# Patient Record
Sex: Female | Born: 1965
Health system: Southern US, Community
[De-identification: ages and names within clinical notes are randomized; demographics above are authoritative.]

## PROBLEM LIST (undated history)

## (undated) DIAGNOSIS — K219 Gastro-esophageal reflux disease without esophagitis: Secondary | ICD-10-CM

## (undated) DIAGNOSIS — F32A Depression, unspecified: Secondary | ICD-10-CM

## (undated) DIAGNOSIS — R32 Unspecified urinary incontinence: Secondary | ICD-10-CM

## (undated) DIAGNOSIS — M1990 Unspecified osteoarthritis, unspecified site: Secondary | ICD-10-CM

## (undated) DIAGNOSIS — E039 Hypothyroidism, unspecified: Secondary | ICD-10-CM

## (undated) DIAGNOSIS — F419 Anxiety disorder, unspecified: Secondary | ICD-10-CM

## (undated) DIAGNOSIS — K921 Melena: Secondary | ICD-10-CM

## (undated) DIAGNOSIS — F329 Major depressive disorder, single episode, unspecified: Secondary | ICD-10-CM

## (undated) HISTORY — DX: Hypothyroidism, unspecified: E03.9

## (undated) HISTORY — DX: Major depressive disorder, single episode, unspecified: F32.9

## (undated) HISTORY — DX: Melena: K92.1

## (undated) HISTORY — DX: Unspecified urinary incontinence: R32

## (undated) HISTORY — DX: Gastro-esophageal reflux disease without esophagitis: K21.9

## (undated) HISTORY — DX: Unspecified osteoarthritis, unspecified site: M19.90

## (undated) HISTORY — DX: Depression, unspecified: F32.A

---

## 2001-01-07 ENCOUNTER — Emergency Department (HOSPITAL_COMMUNITY): Admission: EM | Admit: 2001-01-07 | Discharge: 2001-01-07 | Payer: Self-pay

## 2010-03-03 DIAGNOSIS — R32 Unspecified urinary incontinence: Secondary | ICD-10-CM

## 2010-03-03 HISTORY — DX: Unspecified urinary incontinence: R32

## 2010-03-03 HISTORY — PX: PARTIAL HYSTERECTOMY: SHX80

## 2010-04-10 ENCOUNTER — Encounter (HOSPITAL_COMMUNITY)
Admission: RE | Admit: 2010-04-10 | Discharge: 2010-04-10 | Disposition: A | Discharge status: Home / Self Care | Payer: Managed Care, Other (non HMO) | Source: Ambulatory Visit | Attending: Obstetrics | Admitting: Obstetrics

## 2010-04-10 DIAGNOSIS — Z01812 Encounter for preprocedural laboratory examination: Secondary | ICD-10-CM | POA: Insufficient documentation

## 2010-04-10 LAB — SURGICAL PCR SCREEN
MRSA, PCR: NEGATIVE
Staphylococcus aureus: NEGATIVE

## 2010-04-10 LAB — BASIC METABOLIC PANEL
BUN: 8 mg/dL (ref 6–23)
CO2: 25 mEq/L (ref 19–32)
Calcium: 9.4 mg/dL (ref 8.4–10.5)
Chloride: 104 mEq/L (ref 96–112)
Creatinine, Ser: 0.66 mg/dL (ref 0.4–1.2)
GFR calc Af Amer: >60 mL/min (ref >60)
GFR calc non Af Amer: >60 mL/min (ref >60)
Glucose, Bld: 105 mg/dL — ABNORMAL HIGH (ref 70–99)
Potassium: 4.1 mEq/L (ref 3.5–5.1)
Sodium: 136 mEq/L (ref 135–145)

## 2010-04-10 LAB — CBC
HCT: 44.5 % (ref 36.0–46.0)
Hemoglobin: 14.5 g/dL (ref 12.0–15.0)
MCH: 30 pg (ref 26.0–34.0)
MCHC: 32.6 g/dL (ref 30.0–36.0)
MCV: 92.1 fL (ref 78.0–100.0)
Platelets: 310 10*3/uL (ref 150–400)
RBC: 4.83 MIL/uL (ref 3.87–5.11)
RDW: 13.7 % (ref 11.5–15.5)
WBC: 9.6 10*3/uL (ref 4.0–10.5)

## 2010-04-17 ENCOUNTER — Other Ambulatory Visit: Payer: Self-pay | Admitting: Obstetrics

## 2010-04-17 ENCOUNTER — Ambulatory Visit (HOSPITAL_COMMUNITY)
Admission: RE | Admit: 2010-04-17 | Discharge: 2010-04-18 | Disposition: A | Discharge status: Home / Self Care | Payer: Managed Care, Other (non HMO) | Source: Ambulatory Visit | Attending: Obstetrics | Admitting: Obstetrics

## 2010-04-17 DIAGNOSIS — N949 Unspecified condition associated with female genital organs and menstrual cycle: Secondary | ICD-10-CM | POA: Insufficient documentation

## 2010-04-17 DIAGNOSIS — N920 Excessive and frequent menstruation with regular cycle: Secondary | ICD-10-CM | POA: Insufficient documentation

## 2010-04-17 DIAGNOSIS — D251 Intramural leiomyoma of uterus: Secondary | ICD-10-CM | POA: Insufficient documentation

## 2010-04-17 LAB — TYPE AND SCREEN
ABO/RH(D): A POS
Antibody Screen: NEGATIVE

## 2010-04-17 LAB — PREGNANCY, URINE: Preg Test, Ur: NEGATIVE

## 2010-04-18 LAB — CBC
HCT: 34.9 % — ABNORMAL LOW (ref 36.0–46.0)
Hemoglobin: 11.5 g/dL — ABNORMAL LOW (ref 12.0–15.0)
MCV: 91.8 fL (ref 78.0–100.0)
Platelets: 277 10*3/uL (ref 150–400)
RBC: 3.8 MIL/uL — ABNORMAL LOW (ref 3.87–5.11)
WBC: 16.9 10*3/uL — ABNORMAL HIGH (ref 4.0–10.5)

## 2010-05-10 NOTE — Op Note (Signed)
Jessica Kemp, Jessica Kemp              ACCOUNT NO.:  [id number]  MEDICAL RECORD NO.:  [id number]           PATIENT TYPE:  I  LOCATION:  9310                          FACILITY:  [facility]  PHYSICIAN:  Lendon Colonel, MD   DATE OF BIRTH:  September 17, 1965  DATE OF PROCEDURE:  04/17/2010 DATE OF DISCHARGE:                              OPERATIVE REPORT   PREOPERATIVE DIAGNOSES:  Pelvic pain, menorrhagia, fibroid uterus.  POSTOPERATIVE DIAGNOSES:  Pelvic pain, menorrhagia, fibroid uterus.  PROCEDURE:  Robotic-assisted total laparoscopic hysterectomy.  SURGEON:  Alphonsus Sias. Ernestina Penna, MD  ASSISTANT: 1. Genia Del, MD 2. Arlan Organ, MD  ANESTHESIA:  General.  FINDINGS:  A 2-cm anterior uterine fibroid, adhesed bladder flap, otherwise normal pelvis, normal ovaries, normal tubes, bilateral peristalsing ureters pre and post procedure.  Normal liver edge.  SPECIMENS:  Uterus and cervix to Pathology.  ANTIBIOTICS:  Ancef 1 g.  ESTIMATED BLOOD LOSS:  100 mL.  COMPLICATIONS:  None.  INDICATIONS:  A 45 year old multiparous patient with complaints of menorrhagia and continued pelvic pain.  The patient declined other options of treatment including hormonal management, IUD, endometrial ablation, and opted instead for hysterectomy. Given her prior surgical history a robotic-assisted hysterectomy was done.  PROCEDURE:  After informed consent was obtained, the patient was taken to the operating room where general anesthesia was initiated without difficulty.  She was prepped and draped in the normal sterile fashion in dorsal supine lithotomy position.  A Foley catheter was inserted sterilely into the bladder.  Weighted speculum placed into vagina. Single-tooth tenaculum used to grasp the anterior lip of the cervix. Cervix was sounded and serially dilated to a #21 Pratt.  The #8 RUMI Koh sheath was used with the medium Koh ring.  This was easily inserted into the patient's uterus and gloves were  changed.  Attention was turned to the patient's abdomen.  A 10-mm skin incision was made in the supraumbilical fold after first infusing with 0.5% Marcaine.  Veress needle was inserted into the abdomen.  Pneumoperitoneum was created to 15 mm of CO2.  A non-bladed trocar was then inserted into the peritoneal cavity.  Intraperitoneal placement was confirmed with the use of the laparoscope.  Pneumoperitoneum was maintained.  The abdomen was evaluated.  No adhesions were noted in the anterior abdominal wall, and skin markings were placed on the planned port sites.  These were then injected with 0.5% Marcaine, 8-mm skin incisions were made in the left lateral marks, and an 8 and a 12-mm incision were made on the right side.  The larger one being for the assistant port.  Ports were placed under direct visualization.  The robot was then docked to the patient's ports.  Instruments were placed under visualization with the camera. Once all instruments were in the field of the scope, I went over to the console.  The uterus was anteverted, tubes and ovaries were retracted bilateral, ureters were followed down to the pelvis.  The right round ligament was serially grasped and cauterized with the PK and transected with monopolar scissors.  The anterior leaf of the broad ligament was dissected out with monopolar scissors  and incised.  The right utero- ovarian ligament was serially grasped, cauterized, and eventually transected with the monopolar scissors.  Dissection down the posterior leaf of broad ligament was done with care to avoid the ureter, and the anterior bladder flap was dissected partially.  Some scarring was noted. Attention was turned the patient's left of the pelvis.  The left round ligament was serially grasped, cauterized with PK and transected with monopolar scissors.  The left utero-ovarian ligament was transected after cautery with the bipolar PK device.  The broad ligament was  opened up.  Once at the level of the bladder flap due to the prior adhesions, the bladder was filled with saline to help identify the edges of the bladder.  Monopolar scissors were used to further clear at the midpoint of the bladder dissection.  Once a good plane was noted, additional blunt dissection was done to clear the bladder flap off the cervix.  The uterus was placed on stretch.  The left uterine artery was serially cauterized and then transected.  Once good hemostasis and blanching of the side of the uterus was noted, uterus was placed on stretch, and the right uterine artery was cauterized and transected.  Additional cautery was done over the planned cuff incision, and the monopolar scissors were then used on the anterior vaginal wall to dissect down to the RUMI cup.  Once this was seen, the dissection was carried out laterally, the uterus was then anteverted, a posterior colpotomy was made with the monopolar scissors.  The PK bipolar was used for hemostasis when needed and the colpotomy was completed.  The uterus was removed through the vagina. Some cautery was done on the vaginal cuff.  Instruments were changed, needle drivers were placed into the robotic ports, and the right angle had a figure-of-eight suture along it.  The left angle figure-of-eight was then done and three additional figure-of-eight sutures were placed in the midpoint of the vaginal cuff.  Excellent hemostasis was noted. Irrigation was done.  Again, CO2 was minimized.  No bleeding was noted. Robotic instruments were removed.  The robot was undocked.  We went laparoscopically and placed the patient in reverse Trendelenburg to allow the fluid to collect into the pelvis.  This was suctioned out. Additional irrigation was done.  All cut pedicles were reinspected, found to be hemostatic.  The right lower quadrant port had its fascial incision closed with a 0 Vicryl on a UR hook needle with pneumoperitoneum and  laparoscope to ensure no underlying structures were caught in that stitch.  All gas was removed.  All ports were removed. The fascial incision at the umbilical port was closed with a single figure-of-eight suture.  4-0 Vicryl was used to close the skin and Octylseal was placed at the skin sites.  The patient tolerated the procedure well.  Sponge, lap and needle counts were correct x3.  The patient was taken to the recovery room in stable condition.     Lendon Colonel, MD     KAF/MEDQ  D:  04/17/2010  T:  04/18/2010  Job:  [id number]  Electronically Signed by Noland Fordyce MD on 05/09/2010 07:39:04 PM

## 2015-12-22 ENCOUNTER — Emergency Department (HOSPITAL_COMMUNITY)
Admission: EM | Admit: 2015-12-22 | Discharge: 2015-12-22 | Disposition: A | Discharge status: Home / Self Care | Payer: Managed Care, Other (non HMO) | Attending: Emergency Medicine | Admitting: Emergency Medicine

## 2015-12-22 ENCOUNTER — Emergency Department (HOSPITAL_COMMUNITY): Payer: Managed Care, Other (non HMO)

## 2015-12-22 ENCOUNTER — Encounter (HOSPITAL_COMMUNITY): Payer: Self-pay | Admitting: *Deleted

## 2015-12-22 DIAGNOSIS — R109 Unspecified abdominal pain: Secondary | ICD-10-CM | POA: Diagnosis present

## 2015-12-22 DIAGNOSIS — R197 Diarrhea, unspecified: Secondary | ICD-10-CM | POA: Insufficient documentation

## 2015-12-22 DIAGNOSIS — Z87891 Personal history of nicotine dependence: Secondary | ICD-10-CM | POA: Diagnosis not present

## 2015-12-22 LAB — COMPREHENSIVE METABOLIC PANEL
ALBUMIN: 4.5 g/dL (ref 3.5–5.0)
ALK PHOS: 61 U/L (ref 38–126)
ALT: 45 U/L (ref 14–54)
AST: 24 U/L (ref 15–41)
Anion gap: 9 (ref 5–15)
BILIRUBIN TOTAL: 0.5 mg/dL (ref 0.3–1.2)
BUN: 13 mg/dL (ref 6–20)
CALCIUM: 9.6 mg/dL (ref 8.9–10.3)
CO2: 22 mmol/L (ref 22–32)
Chloride: 106 mmol/L (ref 101–111)
Creatinine, Ser: 0.63 mg/dL (ref 0.44–1.00)
GFR calc Af Amer: >60 mL/min (ref >60)
GLUCOSE: 94 mg/dL (ref 65–99)
POTASSIUM: 4.1 mmol/L (ref 3.5–5.1)
Sodium: 137 mmol/L (ref 135–145)
TOTAL PROTEIN: 7.8 g/dL (ref 6.5–8.1)

## 2015-12-22 LAB — CBC
HEMATOCRIT: 43.5 % (ref 36.0–46.0)
Hemoglobin: 14.8 g/dL (ref 12.0–15.0)
MCH: 30.9 pg (ref 26.0–34.0)
MCHC: 34 g/dL (ref 30.0–36.0)
MCV: 90.8 fL (ref 78.0–100.0)
PLATELETS: 319 10*3/uL (ref 150–400)
RBC: 4.79 MIL/uL (ref 3.87–5.11)
RDW: 13.7 % (ref 11.5–15.5)
WBC: 11.3 10*3/uL — AB (ref 4.0–10.5)

## 2015-12-22 LAB — LIPASE, BLOOD: Lipase: 19 U/L (ref 11–51)

## 2015-12-22 MED ORDER — OXYCODONE-ACETAMINOPHEN 5-325 MG PO TABS
1.0000 | ORAL_TABLET | ORAL | Status: DC | PRN
  Administered 2015-12-22: 1 via ORAL
  Filled 2015-12-22: qty 1

## 2015-12-22 MED ORDER — ONDANSETRON 4 MG PO TBDP
4.0000 mg | ORAL_TABLET | Freq: Once | ORAL | Status: AC | PRN
  Administered 2015-12-22: 4 mg via ORAL
  Filled 2015-12-22: qty 1

## 2015-12-22 MED ORDER — HYDROCODONE-ACETAMINOPHEN 5-325 MG PO TABS: 1.0000 | ORAL_TABLET | ORAL | 0 refills | Status: DC | PRN

## 2015-12-22 MED ORDER — ONDANSETRON HCL 4 MG PO TABS: 4.0000 mg | ORAL_TABLET | Freq: Four times a day (QID) | ORAL | 0 refills | Status: DC

## 2015-12-22 NOTE — ED Provider Notes (Signed)
[facility] DEPT Provider Note   CSN: [id number] Arrival date & time: 12/22/15  1754 By signing my name below, I, Dyke Brackett, attest that this documentation has been prepared under the direction and in the presence of non-physician practitioner, Dewaine Oats PA-C Electronically Signed: Dyke Brackett, Scribe. 12/22/2015. 8:06 PM.    History   Chief Complaint Chief Complaint  Patient presents with  . Flank Pain  . Nausea   HPI [name] is a 50 y.o. female who presents to the Emergency Department complaining of right flank pain onset three days ago. Pain is worsened by sitting, coughing or deep breathing. Pt was given percocet in the ED with relief of pain. She notes associated nausea, diarrhea, and vomiting. Vomiting began today with total of one episode. She states she has had intermittent diarrhea for a few months, currently symptomatic x 4 days. Pt sent from urgent care for further evaluation out of concern for potential problems with her gallbladder or kidneys. UA at Urgent Care reported negative. Abdominal SHX includes cesarean section and hysterectomy. Pt denies any hx of kidney stones. No recent abx use. Pt is a former smoker; she denies any tobacco use for last 8 months. She denies fever, hematuria, melena, hematochezia, or SOB.   The history is provided by the patient. No language interpreter was used.   History reviewed. No pertinent past medical history.  There are no active problems to display for this patient.  History reviewed. No pertinent surgical history.  OB History    No data available     Home Medications    Prior to Admission medications   Not on File   Family History No family history on file.  Social History Social History  Substance Use Topics  . Smoking status: Former Research scientist (life sciences)  . Smokeless tobacco: Never Used  . Alcohol use No   Allergies   Review of patient's allergies indicates not on file.  Review of Systems Review of  Systems  Constitutional: Negative for fever.  HENT: Positive for congestion.   Respiratory: Negative for shortness of breath.   Cardiovascular: Negative for chest pain.  Gastrointestinal: Positive for diarrhea, nausea and vomiting. Negative for blood in stool.  Genitourinary: Positive for flank pain. Negative for hematuria.  Musculoskeletal: Negative for myalgias.  Skin: Negative for color change.  Neurological: Negative for syncope and light-headedness.   Physical Exam Updated Vital Signs BP 128/71   Pulse 74   Temp 98.4 F (36.9 C) (Oral)   Resp 18   SpO2 96%   Physical Exam  Constitutional: She is oriented to person, place, and time. She appears well-developed and well-nourished. No distress.  Well appearing  HENT:  Head: Normocephalic and atraumatic.  Eyes: Conjunctivae are normal.  Cardiovascular: Normal rate.   Pulmonary/Chest: Effort normal. She has no wheezes. She has no rales.  Abdominal: Soft. Bowel sounds are normal. She exhibits no distension. There is no hepatosplenomegaly. There is CVA tenderness (right). There is negative Murphy's sign. No hernia.  No RUQ tenderness  Neurological: She is alert and oriented to person, place, and time.  Skin: Skin is warm and dry.  Psychiatric: She has a normal mood and affect.  Nursing note and vitals reviewed.  ED Treatments / Results  DIAGNOSTIC STUDIES:  Oxygen Saturation is 96% on RA, normal by my interpretation.    COORDINATION OF CARE:  8:25 PM Discussed treatment plan with pt at bedside and pt agreed to plan.  Labs (all labs ordered are listed, but only  abnormal results are displayed) Labs Reviewed  LIPASE, BLOOD  COMPREHENSIVE METABOLIC PANEL  CBC   Results for orders placed or performed during the hospital encounter of 12/22/15  Lipase, blood  Result Value Ref Range   Lipase 19 11 - 51 U/L  Comprehensive metabolic panel  Result Value Ref Range   Sodium 137 135 - 145 mmol/L   Potassium 4.1 3.5 - 5.1  mmol/L   Chloride 106 101 - 111 mmol/L   CO2 22 22 - 32 mmol/L   Glucose, Bld 94 65 - 99 mg/dL   BUN 13 6 - 20 mg/dL   Creatinine, Ser 0.63 0.44 - 1.00 mg/dL   Calcium 9.6 8.9 - 10.3 mg/dL   Total Protein 7.8 6.5 - 8.1 g/dL   Albumin 4.5 3.5 - 5.0 g/dL   AST 24 15 - 41 U/L   ALT 45 14 - 54 U/L   Alkaline Phosphatase 61 38 - 126 U/L   Total Bilirubin 0.5 0.3 - 1.2 mg/dL   GFR calc non Af Amer >60 >60 mL/min   GFR calc Af Amer >60 >60 mL/min   Anion gap 9 5 - 15  CBC  Result Value Ref Range   WBC 11.3 (H) 4.0 - 10.5 K/uL   RBC 4.79 3.87 - 5.11 MIL/uL   Hemoglobin 14.8 12.0 - 15.0 g/dL   HCT 43.5 36.0 - 46.0 %   MCV 90.8 78.0 - 100.0 fL   MCH 30.9 26.0 - 34.0 pg   MCHC 34.0 30.0 - 36.0 g/dL   RDW 13.7 11.5 - 15.5 %   Platelets 319 150 - 400 K/uL   EKG  EKG Interpretation None       Radiology No results found. Ct Renal Stone Study  Result Date: 12/22/2015 CLINICAL DATA:  Jessica Kemp is a 50 y.o. female who presents to the Emergency Department complaining of right flank pain onset three days ago. Pain is worsened by sitting, coughing or deep breathing. Nausea and vomiting started today. Diarrhea for several months. EXAM: CT ABDOMEN AND PELVIS WITHOUT CONTRAST TECHNIQUE: Multidetector CT imaging of the abdomen and pelvis was performed following the standard protocol without IV contrast. COMPARISON:  None. FINDINGS: Lower chest: No pulmonary nodules, pleural effusions, or infiltrates. Heart size is normal. No imaged pericardial effusion or significant coronary artery calcifications. Hepatobiliary: No focal liver abnormality is seen. No radiopaque gallstones, gallbladder wall thickening, or biliary dilatation. Pancreas: Unremarkable. No pancreatic ductal dilatation or surrounding inflammatory changes. Spleen: Normal in size without focal abnormality. Adrenals/Urinary Tract: Adrenal glands are unremarkable. Kidneys are normal, without renal calculi, focal lesion, or  hydronephrosis. Bladder is unremarkable. Stomach/Bowel: Stomach is within normal limits. Appendix appears normal. No evidence of bowel wall thickening, distention, or inflammatory changes. Vascular/Lymphatic: There is mild atherosclerotic calcification of the abdominal aorta. No retroperitoneal or mesenteric adenopathy. Reproductive: Status post hysterectomy.  No adnexal mass. Other: No free pelvic fluid. Anterior abdominal wall is unremarkable. Musculoskeletal: No acute or significant osseous findings. IMPRESSION: Normal exam. Electronically Signed   By: Nolon Nations M.D.   On: 12/22/2015 21:50    Procedures Procedures (including critical care time)  Medications Ordered in ED Medications  oxyCODONE-acetaminophen (PERCOCET/ROXICET) 5-325 MG per tablet 1 tablet (1 tablet Oral Given 12/22/15 1837)  ondansetron (ZOFRAN-ODT) disintegrating tablet 4 mg (4 mg Oral Given 12/22/15 1837)    Initial Impression / Assessment and Plan / ED Course  I have reviewed the triage vital signs and the nursing notes.  Pertinent labs & imaging  results that were available during my care of the patient were reviewed by me and considered in my medical decision making (see chart for details).  Clinical Course   Patient presents with right flank pain, nausea, without fever. She is currently pain and nausea free after Percocet x 1 and Zofran. No history of kidney stones. She was seen at Urgent Care and sent for concern for cholecystitis. At this time she has no RUQ, or other abdominal tenderness. There is CVA tenderness  CT renal study performed and is negative. Pain still controlled. Normal lab studies with stable afebrile VS. She is felt appropriate for discharge home and is encouraged to follow up with PCP for recheck.   Final Clinical Impressions(s) / ED Diagnoses   Final diagnoses:  None  1. Right flank pain  New Prescriptions New Prescriptions   No medications on file  I personally performed the  services described in this documentation, which was scribed in my presence. The recorded information has been reviewed and is accurate.      Charlann Lange, PA-C 12/22/15 [place], MD 12/23/15 [id number]

## 2015-12-22 NOTE — ED Triage Notes (Addendum)
Pt complains of right lower back pain, nausea, diarrhea for the past 4 days. Pt states she vomited when she awoke today. Pt denies urinary symptoms. Pt sent from urgent care for further evaluation and concern for problem with gallbladder. Urinalysis performed at [facility] was negative.

## 2015-12-22 NOTE — ED Notes (Signed)
Pain medication given in Triage. Patient advised about side effects of medications and  to avoid driving for a minimum of 4 hours.  

## 2018-03-17 ENCOUNTER — Ambulatory Visit: Payer: BLUE CROSS/BLUE SHIELD | Admitting: Family

## 2018-03-17 ENCOUNTER — Encounter: Payer: Self-pay | Admitting: Family

## 2018-03-17 VITALS — BP 104/54 | HR 73 | Temp 98.5°F | Resp 16 | Ht 61.0 in | Wt 192.0 lb

## 2018-03-17 DIAGNOSIS — R1011 Right upper quadrant pain: Secondary | ICD-10-CM | POA: Diagnosis not present

## 2018-03-17 DIAGNOSIS — L989 Disorder of the skin and subcutaneous tissue, unspecified: Secondary | ICD-10-CM

## 2018-03-17 DIAGNOSIS — R109 Unspecified abdominal pain: Secondary | ICD-10-CM

## 2018-03-17 DIAGNOSIS — K625 Hemorrhage of anus and rectum: Secondary | ICD-10-CM | POA: Diagnosis not present

## 2018-03-17 DIAGNOSIS — R635 Abnormal weight gain: Secondary | ICD-10-CM

## 2018-03-17 LAB — CBC WITH DIFFERENTIAL/PLATELET
BASOS ABS: 0 10*3/uL (ref 0.0–0.1)
Basophils Relative: 0.4 % (ref 0.0–3.0)
EOS PCT: 1.8 % (ref 0.0–5.0)
Eosinophils Absolute: 0.2 10*3/uL (ref 0.0–0.7)
HEMATOCRIT: 43.1 % (ref 36.0–46.0)
Hemoglobin: 14.7 g/dL (ref 12.0–15.0)
LYMPHS ABS: 2.5 10*3/uL (ref 0.7–4.0)
LYMPHS PCT: 25.9 % (ref 12.0–46.0)
MCHC: 34.1 g/dL (ref 30.0–36.0)
MCV: 90.4 fl (ref 78.0–100.0)
MONOS PCT: 5.1 % (ref 3.0–12.0)
Monocytes Absolute: 0.5 10*3/uL (ref 0.1–1.0)
NEUTROS ABS: 6.3 10*3/uL (ref 1.4–7.7)
NEUTROS PCT: 66.8 % (ref 43.0–77.0)
Platelets: 341 10*3/uL (ref 150.0–400.0)
RBC: 4.77 Mil/uL (ref 3.87–5.11)
RDW: 14.5 % (ref 11.5–15.5)
WBC: 9.5 10*3/uL (ref 4.0–10.5)

## 2018-03-17 LAB — COMPREHENSIVE METABOLIC PANEL
ALT: 17 U/L (ref 0–35)
AST: 13 U/L (ref 0–37)
Albumin: 4.4 g/dL (ref 3.5–5.2)
Alkaline Phosphatase: 59 U/L (ref 39–117)
BILIRUBIN TOTAL: 0.4 mg/dL (ref 0.2–1.2)
BUN: 15 mg/dL (ref 6–23)
CHLORIDE: 102 meq/L (ref 96–112)
CO2: 26 meq/L (ref 19–32)
Calcium: 9.5 mg/dL (ref 8.4–10.5)
Creatinine, Ser: 0.79 mg/dL (ref 0.40–1.20)
GFR: 80.83 mL/min (ref >60.00)
Glucose, Bld: 95 mg/dL (ref 70–99)
POTASSIUM: 4.4 meq/L (ref 3.5–5.1)
Sodium: 137 mEq/L (ref 135–145)
Total Protein: 7.1 g/dL (ref 6.0–8.3)

## 2018-03-17 LAB — TSH: TSH: 1.77 u[IU]/mL (ref 0.35–4.50)

## 2018-03-17 MED ORDER — FLUOXETINE HCL 10 MG PO CAPS: 10.0000 mg | ORAL_CAPSULE | Freq: Every day | ORAL | 0 refills | Status: DC

## 2018-03-17 MED ORDER — PANTOPRAZOLE SODIUM 40 MG PO TBEC: 40.0000 mg | DELAYED_RELEASE_TABLET | Freq: Every day | ORAL | 3 refills | Status: DC

## 2018-03-17 MED FILL — FLUoxetine HCL 10 MG CAPS: 10 | 30 days supply | Qty: 30 | Fill #0

## 2018-03-17 MED FILL — PANTOPRAZOLE SOD DR 40 MG T: 40 | 30 days supply | Qty: 30 | Fill #0

## 2018-03-17 NOTE — Patient Instructions (Signed)
Please complete lab work prior to leaving. Begin protonix for your reflux and work on reflux diet as we discussed today at your visit. Begin prozac 10mg  once daily for depression. You will be contacted about scheduling your CT scan to check your abdomen as well as your referral to GI and Dermatology.

## 2018-03-17 NOTE — Progress Notes (Signed)
Subjective:    Patient ID: Jessica Kemp, female    DOB: December 26, 1965, 53 y.o.   MRN: [id number]  HPI  Patient is a 53 year old female who presents today to establish care.  Past medical history is significant for hypothyroidism-this was diagnosed about 25 years ago.  Reports that she has been having difficulty emptying her bowels. Reports sometimes she has loose stools and sometimes.  Notes severe back pain intermittently.   Reports that she ha shad bladder problems since she had he rc section. Used to have to self catheterize for a month after her son was born. Denies dysuria but has sensation of incomplete bladder emptying.    Notes mucoid stools with blood in her stool 3-4 times over the last several month.    Reports that her right side of her abdomen is distended at time and has associated pain in this area. Worse after working. This has been present x 4-5 weeks.   GERD- Reports that she has had "constant heartburn over the last few months." Taking pepto bismol and tums prn.   Depression- feels that she became depressed when her son was ill.  Continues to feel down. Has had weight gain. Is interested in beginning a medication for depression.       Review of Systems  Constitutional: Positive for unexpected weight change.  HENT: Negative for rhinorrhea.   Eyes: Negative for visual disturbance.  Respiratory: Negative for cough.   Cardiovascular: Negative for leg swelling.  Gastrointestinal: Positive for abdominal distention and abdominal pain.   Past Medical History:  Diagnosis Date  . Arthritis Gerd  . Blood in the stool   . Depression   . GERD (gastroesophageal reflux disease) depression  . Hypothyroidism    25 years ago  . Incontinence in female 2012     Social History   Socioeconomic History  . Marital status: Single    Spouse name: Not on file  . Number of children: 3  . Years of education: Not on file  . Highest education level: Not on file  Occupational  History  . Occupation: Medical illustrator  Social Needs  . Financial resource strain: Not hard at all  . Food insecurity:    Worry: Never true    Inability: Never true  . Transportation needs:    Medical: No    Non-medical: No  Tobacco Use  . Smoking status: Former Smoker    Last attempt to quit: 03/03/2015    Years since quitting: 3.0  . Smokeless tobacco: Never Used  Substance and Sexual Activity  . Alcohol use: No  . Drug use: Yes    Types: Marijuana  . Sexual activity: Not Currently  Lifestyle  . Physical activity:    Days per week: 7 days    Minutes per session: 50 min  . Stress: Not on file  Relationships  . Social connections:    Talks on phone: Three times a week    Gets together: Once a week    Attends religious service: Never    Active member of club or organization: No    Attends meetings of clubs or organizations: Never    Relationship status: Divorced  . Intimate partner violence:    Fear of current or ex partner: Not on file    Emotionally abused: Not on file    Physically abused: Not on file    Forced sexual activity: Not on file  Other Topics Concern  . Not on file  Social History Narrative  Lives with her dog   2 sons   1 daughter   6 grandchildren (one son local) On son lives at [place] and daughter in [place]   Works in Insurance underwriter   Enjoys spending time with her grandchildren.     Past Surgical History:  Procedure Laterality Date  . CESAREAN SECTION  1995  . PARTIAL HYSTERECTOMY  2012    Family History  Problem Relation Age of Onset  . Leukemia Mother   . Diabetes Mother   . Hyperlipidemia Mother   . Stroke Mother   . Arthritis Father   . Diabetes Father   . Hypertension Father   . Stroke Father   . Alcohol abuse Brother   . Arthritis Brother   . Hypertension Brother   . Heart disease Maternal Grandmother   . Heart attack Maternal Grandmother   . Heart attack Maternal Grandfather   . Cancer Paternal Grandmother   . Leukemia  Paternal Grandfather   . Lymphoma Son     No Known Allergies  No current outpatient medications on file prior to visit.   No current facility-administered medications on file prior to visit.     BP (!) 104/54 (BP Location: Right Arm, Patient Position: Sitting, Cuff Size: Large)   Pulse 73   Temp 98.5 F (36.9 C) (Oral)   Resp 16   Ht 5\' 1"  (1.549 m)   Wt 192 lb (87.1 kg)   SpO2 100%   BMI 36.28 kg/m       Objective:   Physical Exam Constitutional:      Appearance: She is well-developed.  HENT:     Head: Normocephalic and atraumatic.  Eyes:     General: No scleral icterus. Neck:     Musculoskeletal: Neck supple.     Thyroid: No thyromegaly.  Cardiovascular:     Rate and Rhythm: Normal rate and regular rhythm.     Heart sounds: Normal heart sounds. No murmur.  Pulmonary:     Effort: Pulmonary effort is normal. No respiratory distress.     Breath sounds: Normal breath sounds. No wheezing.  Abdominal:     General: There is no distension.     Palpations: Abdomen is soft.     Tenderness: There is abdominal tenderness in the right upper quadrant.  Lymphadenopathy:     Cervical: No cervical adenopathy.  Skin:    General: Skin is warm and dry.  Neurological:     Mental Status: She is alert and oriented to person, place, and time.  Psychiatric:        Attention and Perception: Attention normal.        Behavior: Behavior normal.        Thought Content: Thought content normal.        Judgment: Judgment normal.     Comments: Slightly flattened affect   Rectal: ? Internal hemorrhoids, no definite mass.  (Did not obtain enough stool for hemoccult)       Assessment & Plan:  Rectal bleeding- check cbc, refer to GI for further evaluation.  RUQ pain- suspect cholecystitis.  Obtain LFT and abd [place] to further evaluate.  GERD- uncontrolled. Start PPI refer to GI. Could also be related to GB if + cholecystitis.   Depression- trial of prozac 10mg .  I instructed pt to  start 1/2 tablet once daily for 1 week and then increase to a full tablet once daily on week two as tolerated.  We discussed common side effects such as nausea, drowsiness and weight  gain.  Also discussed rare but serious side effect of suicide ideation.  She is instructed to discontinue medication go directly to ED if this occurs.  Pt verbalizes understanding.  Plan follow up in 1 month to evaluate progress.    Weight gain- obtain TSH.   She also requests referral to dermatology for routine skin check. Referral placed.

## 2018-03-18 ENCOUNTER — Telehealth: Payer: Self-pay | Admitting: Family

## 2018-03-18 NOTE — Telephone Encounter (Signed)
-----   Message from Katha Hamming sent at 03/18/2018  2:42 PM EST ----- Regarding: ultrasound We have tried several times to call Chianne to schedule her ultrasound.  The phone # is not working for this patient.   Thanks, Hoyle Sauer

## 2018-03-18 NOTE — Telephone Encounter (Signed)
Jessica Kemp, could you please mail her a letter with the information to contact the [facility] imaging department?

## 2018-03-19 NOTE — Telephone Encounter (Signed)
Patient was calling for [place] appointment information, she was given appointment date and time. I also reviewed her labs with her.

## 2018-03-19 NOTE — Telephone Encounter (Signed)
Patient called in for imaging results , telephone number updated.

## 2018-03-22 ENCOUNTER — Encounter: Payer: Self-pay | Admitting: Gastroenterology

## 2018-03-24 ENCOUNTER — Ambulatory Visit (HOSPITAL_BASED_OUTPATIENT_CLINIC_OR_DEPARTMENT_OTHER)
Admission: RE | Admit: 2018-03-24 | Discharge: 2018-03-24 | Disposition: A | Discharge status: Home / Self Care | Payer: BLUE CROSS/BLUE SHIELD | Source: Ambulatory Visit | Attending: Family | Admitting: Family

## 2018-03-24 DIAGNOSIS — R1011 Right upper quadrant pain: Secondary | ICD-10-CM | POA: Diagnosis not present

## 2018-03-24 DIAGNOSIS — K7689 Other specified diseases of liver: Secondary | ICD-10-CM | POA: Diagnosis not present

## 2018-03-25 ENCOUNTER — Encounter: Payer: Self-pay | Admitting: Family

## 2018-03-26 ENCOUNTER — Ambulatory Visit: Payer: BLUE CROSS/BLUE SHIELD | Admitting: Gastroenterology

## 2018-03-26 ENCOUNTER — Other Ambulatory Visit: Payer: Self-pay

## 2018-03-26 NOTE — Telephone Encounter (Signed)
Patient has appointment scheduled for 03-31-18 with Dr. Bryan Lemma for consultationl they do not have any sooner appointments. Patient is aware of appointment date and time.

## 2018-03-31 ENCOUNTER — Ambulatory Visit (INDEPENDENT_AMBULATORY_CARE_PROVIDER_SITE_OTHER): Payer: BLUE CROSS/BLUE SHIELD | Admitting: Gastroenterology

## 2018-03-31 ENCOUNTER — Encounter: Payer: Self-pay | Admitting: Gastroenterology

## 2018-03-31 VITALS — BP 128/81 | Ht 62.0 in | Wt 189.0 lb

## 2018-03-31 DIAGNOSIS — K921 Melena: Secondary | ICD-10-CM | POA: Diagnosis not present

## 2018-03-31 DIAGNOSIS — K219 Gastro-esophageal reflux disease without esophagitis: Secondary | ICD-10-CM | POA: Diagnosis not present

## 2018-03-31 DIAGNOSIS — K760 Fatty (change of) liver, not elsewhere classified: Secondary | ICD-10-CM

## 2018-03-31 DIAGNOSIS — R195 Other fecal abnormalities: Secondary | ICD-10-CM | POA: Diagnosis not present

## 2018-03-31 DIAGNOSIS — R1011 Right upper quadrant pain: Secondary | ICD-10-CM

## 2018-03-31 MED ORDER — SOD PICOSULFATE-MAG OX-CIT ACD 10-3.5-12 MG-GM -GM/160ML PO SOLN: 1.0000 | ORAL | 0 refills | Status: DC

## 2018-03-31 MED FILL — CLENPIQ 10-3.5-12 MG-GM -GM: 10-3.5-12 M | 5 days supply | Qty: 320 | Fill #0

## 2018-03-31 NOTE — Patient Instructions (Addendum)
If you are age 54 or older, your body mass index should be between 23-30. Your Body mass index is 34.57 kg/m. If this is out of the aforementioned range listed, please consider follow up with your Primary Care Provider.  If you are age 23 or younger, your body mass index should be between 19-25. Your Body mass index is 34.57 kg/m. If this is out of the aformentioned range listed, please consider follow up with your Primary Care Provider.   You have been scheduled for an endoscopy and colonoscopy. Please follow the written instructions given to you at your visit today. Please pick up your prep supplies at the pharmacy within the next 1-3 days. If you use inhalers (even only as needed), please bring them with you on the day of your procedure. Your physician has requested that you go to www.startemmi.com and enter the access code given to you at your visit today. This web site gives a general overview about your procedure. However, you should still follow specific instructions given to you by our office regarding your preparation for the procedure.  We have sent the following medications to your pharmacy for you to pick up at your convenience: Clenpiq  It was a pleasure to see you today!  Vito Cirigliano, D.O.

## 2018-03-31 NOTE — Progress Notes (Signed)
Chief Complaint: Hematochezia, change in bowel habits  Referring Provider:     Debbrah Alar, NP   HPI:    Jessica Kemp is a 53 y.o. female referred to the [facility] for evaluation of hematochezia and change in bowel habits.  She states has had alternating bowel habits for years, variable with diarrhea/constipation. Stools can be skinny/flat. Also intermittent straining with overflow watery stools. 1-2 episodes of BRB mixxed in toilet water per month, occurring over last few months. Sxs all worse since hysterectomy in 2012. Has had a couple episodes of black tarry stools over last 6 weeks as well.   Hx of H pylori, treated 2012 with resolution of abdominal pain at that time.   Also endorsing RUQ pain over the last 6 weeks or so. Pain independent of meals. Non radiating, can occur throughout day.  Normal CBC and CMP and abdominal ultrasound on 03/24/2018 with normal GB, no stones, normal CBD, increased echogenicity in the liver without focal abnormality/mass c/w steatosis.  Separately, she has a longstanding history of GERD with increasing reflux symptoms in the last few months.  Takes Pepto-Bismol and Tums.  She was started on Protonix 40 mg with her next appoint with her PCM on 03/17/2018.  Today she states  Lastly, she has intermittent episodes of sweating and flushing, mostly at night.   No prior EGD or colonoscopy.   No known family history of CRC, GI malignancy, liver disease, pancreatic disease, or IBD.    Past Medical History:  Diagnosis Date  . Arthritis Gerd  . Blood in the stool   . Depression   . GERD (gastroesophageal reflux disease) depression  . Hypothyroidism    25 years ago  . Incontinence in female 2012     Past Surgical History:  Procedure Laterality Date  . CESAREAN SECTION  1995  . PARTIAL HYSTERECTOMY  2012   Family History  Problem Relation Age of Onset  . Leukemia Mother   . Diabetes Mother   . Hyperlipidemia  Mother   . Stroke Mother   . Arthritis Father   . Diabetes Father   . Hypertension Father   . Stroke Father   . Alcohol abuse Brother   . Arthritis Brother   . Hypertension Brother   . Heart disease Maternal Grandmother   . Heart attack Maternal Grandmother   . Heart attack Maternal Grandfather   . Cancer Paternal Grandmother   . Leukemia Paternal Grandfather   . Lymphoma Son    Social History   Tobacco Use  . Smoking status: Former Smoker    Last attempt to quit: 03/03/2015    Years since quitting: 3.0  . Smokeless tobacco: Never Used  Substance Use Topics  . Alcohol use: No  . Drug use: Yes    Types: Marijuana   Current Outpatient Medications  Medication Sig Dispense Refill  . FLUoxetine (PROZAC) 10 MG capsule Take 1 capsule (10 mg total) by mouth at bedtime. 30 capsule 0  . pantoprazole (PROTONIX) 40 MG tablet Take 1 tablet (40 mg total) by mouth daily. 30 tablet 3   No current facility-administered medications for this visit.    No Known Allergies   Review of Systems: All systems reviewed and negative except where noted in HPI.     Physical Exam:    Wt Readings from Last 3 Encounters:  03/17/18 192 lb (87.1 kg)    There were no vitals taken  for this visit. Constitutional:  Pleasant, in no acute distress. Psychiatric: Normal mood and affect. Behavior is normal. EENT: Pupils normal.  Conjunctivae are normal. No scleral icterus. Neck supple. No cervical LAD. Cardiovascular: Normal rate, regular rhythm. No edema Pulmonary/chest: Effort normal and breath sounds normal. No wheezing, rales or rhonchi. Abdominal: Soft, TTP in RUQ, more over right costal margin, without rebound or guarding. Nondistended. Bowel sounds active throughout. There are no masses palpable. No hepatomegaly. Neurological: Alert and oriented to person place and time. Skin: Skin is warm and dry. No rashes noted.   ASSESSMENT AND PLAN;   ELBERTA LACHAPELLE is a 53 y.o. female presenting  with  1) Hematochezia: Intermittent BRBPR without associated anemia.  Clinical presentation seems most consistent with benign anorectal disease (i.e. hemorrhoids), but given age, no prior colonoscopy, associated change in bowel habits, patient concerns, etc. certainly warrants endoscopic evaluation.  -Colonoscopy  2) Change in bowel habits: Long history of alternating bowel habits.  Plan to evaluate for mucosal/luminal etiology with biopsies at time of colonoscopy as above.  Given diarrhea component, can also evaluate for small bowel etiology at time of EGD as below.  -Increase dietary fiber.  Add fiber supplement as needed -Encouraged regular exercise - EGD and colonoscopy with random and directed biopsies  3) RUQ pain: -EGD to evaluate for gastritis, PUD, etc. - Resume PPI as she seems to have had some improvement with this.  Will titrate to lowest effective dose pending endoscopic findings - Otherwise no biliary etiology on recent imaging and labs - If EGD unrevealing, recommend further evaluation for MSK etiology  4) Fatty liver disease: Given body habitus, comorbidities, imaging, suspect NAFLD.  Otherwise with normal liver enzymes and preserved hepatic synthetic function.  - Encouraged dietary/exercise modifications with goal for 10% weight loss over weeks to months - Repeat liver enzymes in 6 months and if still normal can continue dietary/exercise mods - Repeat imaging if change in liver enzymes. -If elevating liver enzymes, can certainly send for extended serologic evaluation to rule out concomitant liver disease  5) GERD: Longstanding history of reflux symptoms.  Improved with recent PPI trial.  Will evaluate for erosive esophagitis, LES laxity, hiatal hernia at time of EGD as planned above.  RTC in 3 to 6 months  The indications, risks, and benefits of EGD and colonoscopy were explained to the patient in detail. Risks include but are not limited to bleeding, perforation,  adverse reaction to medications, and cardiopulmonary compromise. Sequelae include but are not limited to the possibility of surgery, hositalization, and mortality. The patient verbalized understanding and wished to proceed. All questions answered, referred to scheduler and bowel prep ordered. Further recommendations pending results of the exam.   I spent a total of 45 minutes of face-to-face time with the patient. Greater than 50% of the time was spent counseling and coordinating care.    Lavena Bullion, DO, FACG  03/31/2018, 9:08 AM   Debbrah Alar, NP

## 2018-04-06 ENCOUNTER — Encounter: Payer: Self-pay | Admitting: Gastroenterology

## 2018-04-06 ENCOUNTER — Ambulatory Visit (AMBULATORY_SURGERY_CENTER): Payer: BLUE CROSS/BLUE SHIELD | Admitting: Gastroenterology

## 2018-04-06 VITALS — BP 132/81 | HR 70 | Temp 98.9°F | Resp 10 | Ht 62.0 in | Wt 189.0 lb

## 2018-04-06 DIAGNOSIS — K219 Gastro-esophageal reflux disease without esophagitis: Secondary | ICD-10-CM | POA: Diagnosis not present

## 2018-04-06 DIAGNOSIS — R1011 Right upper quadrant pain: Secondary | ICD-10-CM

## 2018-04-06 DIAGNOSIS — D120 Benign neoplasm of cecum: Secondary | ICD-10-CM | POA: Diagnosis not present

## 2018-04-06 DIAGNOSIS — D125 Benign neoplasm of sigmoid colon: Secondary | ICD-10-CM

## 2018-04-06 DIAGNOSIS — K641 Second degree hemorrhoids: Secondary | ICD-10-CM

## 2018-04-06 DIAGNOSIS — R194 Change in bowel habit: Secondary | ICD-10-CM | POA: Diagnosis not present

## 2018-04-06 DIAGNOSIS — K3189 Other diseases of stomach and duodenum: Secondary | ICD-10-CM

## 2018-04-06 DIAGNOSIS — K621 Rectal polyp: Secondary | ICD-10-CM

## 2018-04-06 DIAGNOSIS — D123 Benign neoplasm of transverse colon: Secondary | ICD-10-CM

## 2018-04-06 DIAGNOSIS — K635 Polyp of colon: Secondary | ICD-10-CM | POA: Diagnosis not present

## 2018-04-06 DIAGNOSIS — D129 Benign neoplasm of anus and anal canal: Secondary | ICD-10-CM

## 2018-04-06 DIAGNOSIS — D128 Benign neoplasm of rectum: Secondary | ICD-10-CM

## 2018-04-06 DIAGNOSIS — K921 Melena: Secondary | ICD-10-CM

## 2018-04-06 MED ORDER — SODIUM CHLORIDE 0.9 % IV SOLN: 500.0000 mL | INTRAVENOUS | Status: DC

## 2018-04-06 NOTE — Progress Notes (Signed)
Called to room to assist during endoscopic procedure.  Patient ID and intended procedure confirmed with present staff. Received instructions for my participation in the procedure from the performing physician.  

## 2018-04-06 NOTE — Patient Instructions (Signed)
YOU HAD AN ENDOSCOPIC PROCEDURE TODAY AT [facility]:   Refer to the procedure report that was given to you for any specific questions about what was found during the examination.  If the procedure report does not answer your questions, please call your gastroenterologist to clarify.  If you requested that your care partner not be given the details of your procedure findings, then the procedure report has been included in a sealed envelope for you to review at your convenience later.  **Handouts given on polyps and hemorrhoids**  YOU SHOULD EXPECT: Some feelings of bloating in the abdomen. Passage of more gas than usual.  Walking can help get rid of the air that was put into your GI tract during the procedure and reduce the bloating. If you had a lower endoscopy (such as a colonoscopy or flexible sigmoidoscopy) you may notice spotting of blood in your stool or on the toilet paper. If you underwent a bowel prep for your procedure, you may not have a normal bowel movement for a few days.  Please Note:  You might notice some irritation and congestion in your nose or some drainage.  This is from the oxygen used during your procedure.  There is no need for concern and it should clear up in a day or so.  SYMPTOMS TO REPORT IMMEDIATELY:   Following lower endoscopy (colonoscopy or flexible sigmoidoscopy):  Excessive amounts of blood in the stool  Significant tenderness or worsening of abdominal pains  Swelling of the abdomen that is new, acute  Fever of 100F or higher   Following upper endoscopy (EGD)  Vomiting of blood or coffee ground material  New chest pain or pain under the shoulder blades  Painful or persistently difficult swallowing  New shortness of breath  Fever of 100F or higher  Black, tarry-looking stools  For urgent or emergent issues, a gastroenterologist can be reached at any hour by calling [phone].   DIET:  We do recommend a small meal at first, but  then you may proceed to your regular diet.  Drink plenty of fluids but you should avoid alcoholic beverages for 24 hours.  ACTIVITY:  You should plan to take it easy for the rest of today and you should NOT DRIVE or use heavy machinery until tomorrow (because of the sedation medicines used during the test).    FOLLOW UP: Our staff will call the number listed on your records the next business day following your procedure to check on you and address any questions or concerns that you may have regarding the information given to you following your procedure. If we do not reach you, we will leave a message.  However, if you are feeling well and you are not experiencing any problems, there is no need to return our call.  We will assume that you have returned to your regular daily activities without incident.  If any biopsies were taken you will be contacted by phone or by letter within the next 1-3 weeks.  Please call us at [phone] if you have not heard about the biopsies in 3 weeks.    SIGNATURES/CONFIDENTIALITY: You and/or your care partner have signed paperwork which will be entered into your electronic medical record.  These signatures attest to the fact that that the information above on your After Visit Summary has been reviewed and is understood.  Full responsibility of the confidentiality of this discharge information lies with you and/or your care-partner.

## 2018-04-06 NOTE — Progress Notes (Signed)
Report given to PACU, vss 

## 2018-04-06 NOTE — Op Note (Signed)
[place] Patient Name: Jessica Kemp Procedure Date: 04/06/2018 1:28 PM MRN: [id number] Endoscopist: Gerrit Heck , MD Age: 53 Referring MD:  Date of Birth: [birth year] Gender: Female Account #: [id number] Procedure:                Colonoscopy Indications:              Abdominal pain in the right upper quadrant,                            Hematochezia, Change in bowel habits Medicines:                Monitored Anesthesia Care Procedure:                Pre-Anesthesia Assessment:                           - Prior to the procedure, a History and Physical                            was performed, and patient medications and                            allergies were reviewed. The patient's tolerance of                            previous anesthesia was also reviewed. The risks                            and benefits of the procedure and the sedation                            options and risks were discussed with the patient.                            All questions were answered, and informed consent                            was obtained. Prior Anticoagulants: The patient has                            taken no previous anticoagulant or antiplatelet                            agents. ASA Grade Assessment: II - A patient with                            mild systemic disease. After reviewing the risks                            and benefits, the patient was deemed in                            satisfactory condition to undergo the procedure.  After obtaining informed consent, the colonoscope                            was passed under direct vision. Throughout the                            procedure, the patient's blood pressure, pulse, and                            oxygen saturations were monitored continuously. The                            Colonoscope was introduced through the anus and                            advanced to the the terminal  ileum. The colonoscopy                            was performed without difficulty. The patient                            tolerated the procedure well. The quality of the                            bowel preparation was adequate. The terminal ileum,                            ileocecal valve, appendiceal orifice, and rectum                            were photographed. Scope In: 1:44:26 PM Scope Out: 2:03:01 PM Scope Withdrawal Time: 0 hours 13 minutes 20 seconds  Total Procedure Duration: 0 hours 18 minutes 35 seconds  Findings:                 The perianal and digital rectal examinations were                            normal.                           A 8 mm polyp was found in the transverse colon. The                            polyp was sessile. The polyp was removed with a                            cold snare. Resection and retrieval were complete.                            Estimated blood loss was minimal.                           A 3 mm polyp was found in the cecum. The polyp was  sessile. The polyp was removed with a cold snare.                            Resection and retrieval were complete. Estimated                            blood loss was minimal.                           Four sessile polyps were found in the sigmoid                            colon. The polyps were 2 to 3 mm in size. These                            polyps were removed with a cold biopsy forceps.                            Resection and retrieval were complete. Estimated                            blood loss was minimal.                           A few sessile polyps were found in the rectum                            (benign-appearing lesion). The polyps were 1 to 2                            mm in size. A few of these polyps were removed with                            a cold biopsy forceps for histologic representative                            evaluation. Resection and  retrieval were complete.                            Estimated blood loss was minimal.                           The mucosa was otherwise normal appearing                            throughout the colon. There were no areas of                            mucosal erythema, edema, erosions, or ulceration                            noted on this study. Biopsies for histology were  taken with a cold forceps from the right colon and                            left colon for evaluation of microscopic colitis.                            Estimated blood loss was minimal.                           Non-bleeding internal hemorrhoids were found during                            retroflexion. The hemorrhoids were medium-sized.                           The terminal ileum appeared normal. Complications:            No immediate complications. Estimated Blood Loss:     Estimated blood loss was minimal. Impression:               - One 8 mm polyp in the transverse colon, removed                            with a cold snare. Resected and retrieved.                           - One 3 mm polyp in the cecum, removed with a cold                            snare. Resected and retrieved.                           - Four 2 to 3 mm polyps in the sigmoid colon,                            removed with a cold biopsy forceps. Resected and                            retrieved.                           - A few benign appearing 1 to 2 mm polyps in the                            rectum, removed with a cold biopsy forceps.                            Resected and retrieved.                           - Normal mucosa in the entire examined colon.                            Biopsied.                           -  Non-bleeding internal hemorrhoids.                           - The examined portion of the ileum was normal. Recommendation:           - Patient has a contact number available for                             emergencies. The signs and symptoms of potential                            delayed complications were discussed with the                            patient. Return to normal activities tomorrow.                            Written discharge instructions were provided to the                            patient.                           - Resume previous diet today.                           - Continue present medications.                           - Use fiber, for example Citrucel, Fibercon, Konsyl                            or Metamucil.                           - Repeat colonoscopy in 3 - 5 years for                            surveillance based on pathology results.                           - Return to GI clinic PRN.                           - Internal hemorrhoids were noted on this study and                            may be amenable to hemorrhoid band ligation. If you                            are interested in further treatment of these                            hemorrhoids with band ligation, please contact my  clinic to set up an appointment for evaluation and                            treatment. Gerrit Heck, MD 04/06/2018 2:14:11 PM

## 2018-04-06 NOTE — Op Note (Signed)
[place] Patient Name: Jessica Kemp Procedure Date: 04/06/2018 1:29 PM MRN: [id number] Endoscopist: Gerrit Heck , MD Age: 53 Referring MD:  Date of Birth: 08/07/1965 Gender: Female Account #: [id number] Procedure:                Upper GI endoscopy Indications:              Abdominal pain in the right upper quadrant,                            Suspected esophageal reflux, Hematochezia, Diarrhea Medicines:                Monitored Anesthesia Care Procedure:                Pre-Anesthesia Assessment:                           - Prior to the procedure, a History and Physical                            was performed, and patient medications and                            allergies were reviewed. The patient's tolerance of                            previous anesthesia was also reviewed. The risks                            and benefits of the procedure and the sedation                            options and risks were discussed with the patient.                            All questions were answered, and informed consent                            was obtained. Prior Anticoagulants: The patient has                            taken no previous anticoagulant or antiplatelet                            agents. ASA Grade Assessment: II - A patient with                            mild systemic disease. After reviewing the risks                            and benefits, the patient was deemed in                            satisfactory condition to undergo the procedure.  After obtaining informed consent, the endoscope was                            passed under direct vision. Throughout the                            procedure, the patient's blood pressure, pulse, and                            oxygen saturations were monitored continuously. The                            Endoscope was introduced through the mouth, and                            advanced  to the third part of duodenum. The upper                            GI endoscopy was accomplished without difficulty.                            The patient tolerated the procedure well. Scope In: Scope Out: Findings:                 The examined esophagus was normal.                           Esophagogastric landmarks were identified: the                            Z-line was found at 37 cm, the gastroesophageal                            junction was found at 37 cm and the site of hiatal                            narrowing was found at 37 cm from the incisors.                           The gastroesophageal flap valve was visualized                            endoscopically and classified as Hill Grade II                            (fold present, opens with respiration).                           The entire examined stomach was normal. Biopsies                            were taken with a cold forceps for Helicobacter  pylori testing. Estimated blood loss was minimal.                           The duodenal bulb, first portion of the duodenum                            and second portion of the duodenum were normal.                            Biopsies for histology were taken with a cold                            forceps for evaluation of celiac disease. Estimated                            blood loss was minimal. Complications:            No immediate complications. Estimated Blood Loss:     Estimated blood loss was minimal. Impression:               - Normal esophagus.                           - Esophagogastric landmarks identified.                           - Gastroesophageal flap valve classified as Hill                            Grade II (fold present, opens with respiration).                           - Normal stomach. Biopsied.                           - Normal duodenal bulb, first portion of the                            duodenum and second portion  of the duodenum.                            Biopsied. Recommendation:           - Patient has a contact number available for                            emergencies. The signs and symptoms of potential                            delayed complications were discussed with the                            patient. Return to normal activities tomorrow.                            Written discharge instructions were provided to the  patient.                           - Resume previous diet today.                           - Continue present medications.                           - Await pathology results.                           - Perform a colonoscopy today. Gerrit Heck, MD 04/06/2018 2:08:03 PM

## 2018-04-07 ENCOUNTER — Telehealth: Payer: Self-pay | Admitting: *Deleted

## 2018-04-07 ENCOUNTER — Telehealth: Payer: Self-pay

## 2018-04-07 NOTE — Telephone Encounter (Signed)
  Follow up Call-  Call back number 04/06/2018  Post procedure Call Back phone  # [phone]  Permission to leave phone message Yes  Some recent data might be hidden     Patient questions:  Do you have a fever, pain , or abdominal swelling? No. Pain Score  0 *  Have you tolerated food without any problems? Yes.    Have you been able to return to your normal activities? Yes.    Do you have any questions about your discharge instructions: Diet   No. Medications  No. Follow up visit  No.  Do you have questions or concerns about your Care? No.  Actions: * If pain score is 4 or above: No action needed, pain <4.

## 2018-04-07 NOTE — Telephone Encounter (Signed)
Follow up call made, name identifier, left a voicemail. 

## 2018-04-08 ENCOUNTER — Encounter: Payer: Self-pay | Admitting: Family

## 2018-04-12 ENCOUNTER — Encounter: Payer: Self-pay | Admitting: Gastroenterology

## 2018-04-13 ENCOUNTER — Encounter: Payer: Self-pay | Admitting: Family

## 2018-04-14 ENCOUNTER — Ambulatory Visit: Payer: BLUE CROSS/BLUE SHIELD | Admitting: Family

## 2018-04-16 ENCOUNTER — Ambulatory Visit: Payer: BLUE CROSS/BLUE SHIELD | Admitting: Family

## 2018-04-16 MED FILL — PANTOPRAZOLE SOD DR 40 MG T: 40 | 30 days supply | Qty: 30 | Fill #1

## 2018-04-21 ENCOUNTER — Encounter: Payer: Self-pay | Admitting: Family

## 2018-04-22 ENCOUNTER — Ambulatory Visit: Payer: BLUE CROSS/BLUE SHIELD | Admitting: Family

## 2018-05-21 ENCOUNTER — Ambulatory Visit: Payer: BLUE CROSS/BLUE SHIELD | Admitting: Family

## 2018-06-01 MED FILL — PANTOPRAZOLE SOD DR 40 MG T: 40 | 30 days supply | Qty: 30 | Fill #2

## 2018-07-14 MED FILL — PANTOPRAZOLE SOD DR 40 MG T: 40 | 30 days supply | Qty: 30 | Fill #3

## 2018-07-20 ENCOUNTER — Encounter: Payer: Self-pay | Admitting: Family

## 2018-07-21 ENCOUNTER — Other Ambulatory Visit: Payer: Self-pay

## 2018-07-21 ENCOUNTER — Telehealth (INDEPENDENT_AMBULATORY_CARE_PROVIDER_SITE_OTHER): Payer: BLUE CROSS/BLUE SHIELD | Admitting: Family

## 2018-07-21 ENCOUNTER — Telehealth: Payer: BLUE CROSS/BLUE SHIELD | Admitting: Family

## 2018-07-21 DIAGNOSIS — M549 Dorsalgia, unspecified: Secondary | ICD-10-CM

## 2018-07-21 DIAGNOSIS — R300 Dysuria: Secondary | ICD-10-CM

## 2018-07-21 DIAGNOSIS — R109 Unspecified abdominal pain: Secondary | ICD-10-CM | POA: Diagnosis not present

## 2018-07-21 LAB — URINALYSIS, ROUTINE W REFLEX MICROSCOPIC
Bilirubin Urine: NEGATIVE
Hgb urine dipstick: NEGATIVE
Ketones, ur: NEGATIVE
Leukocytes,Ua: NEGATIVE
Nitrite: NEGATIVE
RBC / HPF: NONE SEEN (ref 0–?)
Specific Gravity, Urine: 1.02 (ref 1.000–1.030)
Total Protein, Urine: NEGATIVE
Urine Glucose: NEGATIVE
Urobilinogen, UA: 0.2 (ref 0.0–1.0)
pH: 6.5 (ref 5.0–8.0)

## 2018-07-21 LAB — BASIC METABOLIC PANEL
BUN: 14 mg/dL (ref 6–23)
CO2: 29 mEq/L (ref 19–32)
Calcium: 9.6 mg/dL (ref 8.4–10.5)
Chloride: 103 mEq/L (ref 96–112)
Creatinine, Ser: 0.82 mg/dL (ref 0.40–1.20)
GFR: 72.75 mL/min (ref >60.00)
Glucose, Bld: 121 mg/dL — ABNORMAL HIGH (ref 70–99)
Potassium: 4.3 mEq/L (ref 3.5–5.1)
Sodium: 140 mEq/L (ref 135–145)

## 2018-07-21 MED ORDER — CIPROFLOXACIN HCL 500 MG PO TABS: 500.0000 mg | ORAL_TABLET | Freq: Two times a day (BID) | ORAL | 0 refills | Status: DC

## 2018-07-21 MED FILL — CIPROFLOXACIN HCL 500 MG TA: 500 | 7 days supply | Qty: 14 | Fill #0

## 2018-07-21 NOTE — Progress Notes (Signed)
Virtual Visit via Video Note  I connected with Oletha Cruel on 07/21/18 at 11:20 AM EDT by telephone after we were unable to successfully connect via video. I verified that I am speaking with the correct person using two identifiers. This visit type was conducted due to national recommendations for restrictions regarding the COVID-19 Pandemic (e.g. social distancing).  This format is felt to be most appropriate for this patient at this time.   I discussed the limitations of evaluation and management by telemedicine and the availability of in person appointments. The patient expressed understanding and agreed to proceed.  Only the patient and myself were on today's video visit. The patient was at home and I was in my office at the time of today's visit.   History of Present Illness:   Patient is a 53 yr old female who presents today with several concerns:  1) Left sided low back pain- reports that back pain started yesterday. Pain is located in the left mid back. She reports associated urinary frequency, urgency and nausea. Denies associated vomiting. Reports that she initially thought she overdid it with her back and taking care of her grand kids. Bought otc pain patches.  Monday AM she had sudden pain and nausea. Denies associated fever.  Denies history of kidney stones. Denies hematuria. Reports that urine is cloudy and perhaps has a mild odor.   Past Medical History:  Diagnosis Date  . Arthritis Gerd  . Blood in the stool   . Depression   . GERD (gastroesophageal reflux disease) depression  . Hypothyroidism    25 years ago  . Incontinence in female 2012     Social History   Socioeconomic History  . Marital status: Divorced    Spouse name: Not on file  . Number of children: 3  . Years of education: Not on file  . Highest education level: Not on file  Occupational History  . Occupation: Medical illustrator  Social Needs  . Financial resource strain: Not hard at all  . Food  insecurity:    Worry: Never true    Inability: Never true  . Transportation needs:    Medical: No    Non-medical: No  Tobacco Use  . Smoking status: Former Smoker    Last attempt to quit: 03/03/2015    Years since quitting: 3.3  . Smokeless tobacco: Never Used  Substance and Sexual Activity  . Alcohol use: No  . Drug use: Yes    Types: Marijuana  . Sexual activity: Not Currently  Lifestyle  . Physical activity:    Days per week: 7 days    Minutes per session: 50 min  . Stress: Not on file  Relationships  . Social connections:    Talks on phone: Three times a week    Gets together: Once a week    Attends religious service: Never    Active member of club or organization: No    Attends meetings of clubs or organizations: Never    Relationship status: Divorced  . Intimate partner violence:    Fear of current or ex partner: Not on file    Emotionally abused: Not on file    Physically abused: Not on file    Forced sexual activity: Not on file  Other Topics Concern  . 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     Past Surgical History:  Procedure Laterality Date  . CESAREAN SECTION  1995  . PARTIAL HYSTERECTOMY  2012    Family History  Problem Relation Age of Onset  . Leukemia Mother   . Diabetes Mother   . Hyperlipidemia Mother   . Stroke Mother   . Arthritis Father   . Diabetes Father   . Hypertension Father   . Stroke Father   . Alcohol abuse Brother   . Arthritis Brother   . Hypertension Brother   . Heart disease Maternal Grandmother   . Heart attack Maternal Grandmother   . Heart attack Maternal Grandfather   . Cancer Paternal Grandmother   . Leukemia Paternal Grandfather   . Lymphoma Son   . Colon cancer Neg Hx   . Esophageal cancer Neg Hx   . Stomach cancer Neg Hx   . Rectal cancer  Neg Hx     No Known Allergies  Current Outpatient Medications on File Prior to Visit  Medication Sig Dispense Refill  . pantoprazole (PROTONIX) 40 MG tablet Take 1 tablet (40 mg total) by mouth daily. 30 tablet 3  . FLUoxetine (PROZAC) 10 MG capsule Take 1 capsule (10 mg total) by mouth at bedtime. (Patient not taking: Reported on 07/21/2018) 30 capsule 0   No current facility-administered medications on file prior to visit.     There were no vitals taken for this visit.   Observations/Objective:   Gen: Awake, alert, no acute distress Resp: Breathing is even and non-labored Psych: calm/pleasant demeanor Neuro: Alert and Oriented x 3, + facial symmetry, speech is clear.  Assessment and Plan:  Flank pain- Most likely pyelonephritis, but would like to evaluate for hematuria via UA to evaluate for possible kidney stone.  If + for blood would consider CT to rule out stone.  Will begin empiric cipro, send urine for culture and obtain bmet to assess renal function. Pt is advised to call if new/worsening symptoms or if symptoms fail to improve in the next few days.   Follow Up Instructions:  15 minutes spent with patient on today's phone call.    I discussed the assessment and treatment plan with the patient. The patient was provided an opportunity to ask questions and all were answered. The patient agreed with the plan and demonstrated an understanding of the instructions.   The patient was advised to call back or seek an in-person evaluation if the symptoms worsen or if the condition fails to improve as anticipated.    Nance Pear, NP

## 2018-07-21 NOTE — Progress Notes (Signed)
Based on what you shared with me, I feel your condition warrants further evaluation and I recommend that you be seen for a face to face office visit.  Given your symptoms you need to be seen face to face for a urine culture.    NOTE: If you entered your credit card information for this eVisit, you will not be charged. You may see a "hold" on your card for the $35 but that hold will drop off and you will not have a charge processed.  If you are having a true medical emergency please call 911.  If you need an urgent face to face visit,  has four urgent care centers for your convenience.    PLEASE NOTE: THE [facility] LOCATIONS AND URGENT CARE CLINICS DO NOT HAVE THE TESTING FOR CORONAVIRUS COVID19 AVAILABLE.  IF YOU FEEL YOU NEED THIS TEST YOU MUST GO TO A TRIAGE LOCATION AT [place]   [email or web address] to reserve your spot online an avoid wait times  [facility] [address], [address] [place], [place] [address] Modified hours of operation: Monday-Friday, 12 PM to 6 PM  Saturday & Sunday 10 AM to 4 PM *Across the street from [place] (New Address!) [address] [place], [place] [address] *Just off [address], across the road from [place] hours of operation: Monday-Friday, 12 PM to 6 PM  Closed Saturday & Sunday  [facility]'s modified hours of operation will be in effect from May 1 until May 31   The following sites will take your insurance:  . [facility] [place] a Provider at this Location  [address] [place], [place] [address] . 10 am to 8 pm Monday-Friday . 12 pm to 8 pm Saturday-Sunday   . [facility] [place] a Provider at this Location  [address], [place], [place] [address] . 8 am to 8 pm  Monday-Friday . 9 am to 6 pm Saturday . 11 am to 6 pm Sunday   . [facility] at [place] Get Driving Directions  [address] [place], [place] [address] . 8 am to 8 pm Monday-Friday . 8 am to 4 pm Saturday-Sunday   Your e-visit answers were reviewed by a board certified advanced clinical practitioner to complete your personal care plan.  Thank you for using e-Visits.

## 2018-07-22 ENCOUNTER — Encounter (HOSPITAL_BASED_OUTPATIENT_CLINIC_OR_DEPARTMENT_OTHER): Payer: Self-pay | Admitting: Emergency Medicine

## 2018-07-22 ENCOUNTER — Other Ambulatory Visit: Payer: Self-pay

## 2018-07-22 ENCOUNTER — Ambulatory Visit: Payer: Self-pay | Admitting: Family

## 2018-07-22 ENCOUNTER — Emergency Department (HOSPITAL_BASED_OUTPATIENT_CLINIC_OR_DEPARTMENT_OTHER): Payer: BLUE CROSS/BLUE SHIELD

## 2018-07-22 ENCOUNTER — Emergency Department (HOSPITAL_BASED_OUTPATIENT_CLINIC_OR_DEPARTMENT_OTHER)
Admission: EM | Admit: 2018-07-22 | Discharge: 2018-07-22 | Disposition: A | Discharge status: Home / Self Care | Payer: BLUE CROSS/BLUE SHIELD | Attending: Emergency Medicine | Admitting: Emergency Medicine

## 2018-07-22 DIAGNOSIS — Z87891 Personal history of nicotine dependence: Secondary | ICD-10-CM | POA: Insufficient documentation

## 2018-07-22 DIAGNOSIS — E039 Hypothyroidism, unspecified: Secondary | ICD-10-CM | POA: Diagnosis not present

## 2018-07-22 DIAGNOSIS — R39198 Other difficulties with micturition: Secondary | ICD-10-CM | POA: Diagnosis not present

## 2018-07-22 DIAGNOSIS — R1032 Left lower quadrant pain: Secondary | ICD-10-CM | POA: Diagnosis not present

## 2018-07-22 DIAGNOSIS — Z79899 Other long term (current) drug therapy: Secondary | ICD-10-CM | POA: Insufficient documentation

## 2018-07-22 DIAGNOSIS — R109 Unspecified abdominal pain: Secondary | ICD-10-CM | POA: Diagnosis not present

## 2018-07-22 LAB — BASIC METABOLIC PANEL
Anion gap: 11 (ref 5–15)
BUN: 16 mg/dL (ref 6–20)
CO2: 22 mmol/L (ref 22–32)
Calcium: 9.6 mg/dL (ref 8.9–10.3)
Chloride: 105 mmol/L (ref 98–111)
Creatinine, Ser: 0.87 mg/dL (ref 0.44–1.00)
GFR calc Af Amer: >60 mL/min (ref >60)
GFR calc non Af Amer: >60 mL/min (ref >60)
Glucose, Bld: 97 mg/dL (ref 70–99)
Potassium: 4.6 mmol/L (ref 3.5–5.1)
Sodium: 138 mmol/L (ref 135–145)

## 2018-07-22 LAB — URINALYSIS, ROUTINE W REFLEX MICROSCOPIC
Bilirubin Urine: NEGATIVE
Glucose, UA: NEGATIVE mg/dL
Hgb urine dipstick: NEGATIVE
Ketones, ur: NEGATIVE mg/dL
Leukocytes,Ua: NEGATIVE
Nitrite: NEGATIVE
Protein, ur: NEGATIVE mg/dL
Specific Gravity, Urine: 1.01 (ref 1.005–1.030)
pH: 6 (ref 5.0–8.0)

## 2018-07-22 LAB — CBC
HCT: 45.8 % (ref 36.0–46.0)
Hemoglobin: 14.9 g/dL (ref 12.0–15.0)
MCH: 29.8 pg (ref 26.0–34.0)
MCHC: 32.5 g/dL (ref 30.0–36.0)
MCV: 91.6 fL (ref 80.0–100.0)
Platelets: 298 10*3/uL (ref 150–400)
RBC: 5 MIL/uL (ref 3.87–5.11)
RDW: 13.4 % (ref 11.5–15.5)
WBC: 8.8 10*3/uL (ref 4.0–10.5)
nRBC: 0 % (ref 0.0–0.2)

## 2018-07-22 MED ORDER — KETOROLAC TROMETHAMINE 30 MG/ML IJ SOLN
30.0000 mg | Freq: Once | INTRAMUSCULAR | Status: AC
  Administered 2018-07-22: 13:00:00 30 mg via INTRAMUSCULAR

## 2018-07-22 MED ORDER — KETOROLAC TROMETHAMINE 30 MG/ML IJ SOLN: 30.0000 mg | Freq: Once | INTRAMUSCULAR | Status: DC

## 2018-07-22 MED ORDER — NAPROXEN 500 MG PO TABS: 500.0000 mg | ORAL_TABLET | Freq: Two times a day (BID) | ORAL | 0 refills | Status: DC

## 2018-07-22 MED ORDER — CYCLOBENZAPRINE HCL 10 MG PO TABS: 10.0000 mg | ORAL_TABLET | Freq: Two times a day (BID) | ORAL | 0 refills | Status: DC | PRN

## 2018-07-22 MED FILL — CYCLOBENZAPRINE HCL 10 MG T: 10 | 10 days supply | Qty: 20 | Fill #0

## 2018-07-22 MED FILL — NAPROXEN 500 MG TABLET: 500 | 10 days supply | Qty: 20 | Fill #0

## 2018-07-22 NOTE — ED Triage Notes (Addendum)
L flank pain with nausea x 4 days. Reports difficulty with urinating. She had blood and a UA done yesterday, still waiting on results. She was started on cipro yesterday.

## 2018-07-22 NOTE — Telephone Encounter (Signed)
Pt called in c/o not being able to urinate since yesterday except a little tiny bit.  She's in worse pain than she was yesterday when she did a virtual visit (COVID-19 pandemic) with Debbrah Alar, NP for left lower back pain and urinary symptoms.  I have instructed her to go to the ED.   I checked with Melissa's flow coordinator because pt really did not want to go to the ED however they agreed she needed to go on to the ED.  Pt agreed and is going to the ED.   See triage notes.  I sent these notes to [facility] office.   Reason for Disposition . [1] Unable to urinate (or only a few drops) > 4 hours AND     [2] bladder feels very full (e.g., palpable bladder or strong urge to urinate)  Answer Assessment - Initial Assessment Questions 1. SYMPTOM: "What's the main symptom you're concerned about?" (e.g., frequency, incontinence)     I have a very full bladder.  It's only coming out a little of the time.   Worse pain today in left back pain.   Maybe a stone. 2. ONSET: "When did the  pain  start?"     07/20/2018 3. PAIN: "Is there any pain?" If so, ask: "How bad is it?" (Scale: 1-10; mild, moderate, severe)     The pain is worse 4. CAUSE: "What do you think is causing the symptoms?"     Kidney problem infection 5. OTHER SYMPTOMS: "Do you have any other symptoms?" (e.g., fever, flank pain, blood in urine, pain with urination)     Left lower back pain.   I'm hurting from my full bladder.   6. PREGNANCY: "Is there any chance you are pregnant?" "When was your last menstrual period?"     Not asked  Protocols used: URINARY [facility]

## 2018-07-22 NOTE — Discharge Instructions (Addendum)
Take the anti-inflammatory muscle relaxants as prescribed, follow-up with your doctor if your symptoms are not improving in the next week

## 2018-07-22 NOTE — ED Notes (Signed)
ED Provider at bedside. 

## 2018-07-22 NOTE — ED Provider Notes (Signed)
[facility] EMERGENCY DEPARTMENT Provider Note   CSN: [id number] Arrival date & time: 07/22/18  1126    History   Chief Complaint Chief Complaint  Patient presents with   Flank Pain    HPI Jessica Kemp is a 53 y.o. female.     HPI Presents to the emergency room for evaluation of flank pain.  Patient states her symptoms started a couple days ago.  She began having pain in her left flank.  The pain is sharp and stays in the left flank.  She has had nausea but no vomiting.  She has had some discomfort with urination.  She saw her primary care doctor yesterday on a video visit.  Outpatient laboratory tests were ordered.  This morning her symptoms were more severe so she was instructed to come to the ED.  She has not had any results back from the tests that were done. Past Medical History:  Diagnosis Date   Arthritis Gerd   Blood in the stool    Depression    GERD (gastroesophageal reflux disease) depression   Hypothyroidism    25 years ago   Incontinence in female 2012    There are no active problems to display for this patient.   Past Surgical History:  Procedure Laterality Date   [place]   PARTIAL HYSTERECTOMY  2012     OB History   No obstetric history on file.      Home Medications    Prior to Admission medications   Medication Sig Start Date End Date Taking? Authorizing Provider  ciprofloxacin (CIPRO) 500 MG tablet Take 1 tablet (500 mg total) by mouth 2 (two) times daily. 07/21/18   Debbrah Alar, NP  cyclobenzaprine (FLEXERIL) 10 MG tablet Take 1 tablet (10 mg total) by mouth 2 (two) times daily as needed for muscle spasms. 07/22/18   Dorie Rank, MD  FLUoxetine (PROZAC) 10 MG capsule Take 1 capsule (10 mg total) by mouth at bedtime. Patient not taking: Reported on 07/21/2018 03/17/18   Debbrah Alar, NP  naproxen (NAPROSYN) 500 MG tablet Take 1 tablet (500 mg total) by mouth 2 (two) times daily with a meal. As  needed for pain 07/22/18   Dorie Rank, MD  pantoprazole (PROTONIX) 40 MG tablet Take 1 tablet (40 mg total) by mouth daily. 03/17/18   Debbrah Alar, NP    Family History Family History  Problem Relation Age of Onset   Leukemia Mother    Diabetes Mother    Hyperlipidemia Mother    Stroke Mother    Arthritis Father    Diabetes Father    Hypertension Father    Stroke Father    Alcohol abuse Brother    Arthritis Brother    Hypertension Brother    Heart disease Maternal Grandmother    Heart attack Maternal Grandmother    Heart attack Maternal Grandfather    Cancer Paternal Grandmother    Leukemia Paternal Grandfather    Lymphoma Son    Colon cancer Neg Hx    Esophageal cancer Neg Hx    Stomach cancer Neg Hx    Rectal cancer Neg Hx     Social History Social History   Tobacco Use   Smoking status: Former Smoker    Last attempt to quit: 03/03/2015    Years since quitting: 3.3   Smokeless tobacco: Never Used  Substance Use Topics   Alcohol use: No   Drug use: Yes    Types: Marijuana  Allergies   Patient has no known allergies.   Review of Systems Review of Systems  All other systems reviewed and are negative.    Physical Exam Updated Vital Signs BP 138/80 (BP Location: Right Arm)    Pulse 86    Temp 98.1 F (36.7 C) (Oral)    Resp 20    Ht 1.575 m (5\' 2" )    Wt 86.2 kg    SpO2 99%    BMI 34.75 kg/m   Physical Exam Vitals signs and nursing note reviewed.  Constitutional:      General: She is not in acute distress.    Appearance: She is well-developed.  HENT:     Head: Normocephalic and atraumatic.     Right Ear: External ear normal.     Left Ear: External ear normal.  Eyes:     General: No scleral icterus.       Right eye: No discharge.        Left eye: No discharge.     Conjunctiva/sclera: Conjunctivae normal.  Neck:     Musculoskeletal: Neck supple.     Trachea: No tracheal deviation.  Cardiovascular:     Rate and  Rhythm: Normal rate and regular rhythm.  Pulmonary:     Effort: Pulmonary effort is normal. No respiratory distress.     Breath sounds: Normal breath sounds. No stridor. No wheezing or rales.  Abdominal:     General: Bowel sounds are normal. There is no distension.     Palpations: Abdomen is soft.     Tenderness: There is no abdominal tenderness. There is left CVA tenderness. There is no guarding or rebound.  Musculoskeletal:        General: No tenderness.  Skin:    General: Skin is warm and dry.     Findings: No rash.     Comments: No rash noted in the flank  Neurological:     Mental Status: She is alert.     Cranial Nerves: No cranial nerve deficit (no facial droop, extraocular movements intact, no slurred speech).     Sensory: No sensory deficit.     Motor: No abnormal muscle tone or seizure activity.     Coordination: Coordination normal.      ED Treatments / Results  Labs (all labs ordered are listed, but only abnormal results are displayed) Labs Reviewed  URINALYSIS, ROUTINE W REFLEX MICROSCOPIC  CBC  BASIC METABOLIC PANEL    EKG None  Radiology Ct Renal Stone Study  Result Date: 07/22/2018 CLINICAL DATA:  Left flank pain, nausea, difficulty urinating EXAM: CT ABDOMEN AND PELVIS WITHOUT CONTRAST TECHNIQUE: Multidetector CT imaging of the abdomen and pelvis was performed following the standard protocol without IV contrast. COMPARISON:  12/22/2015 FINDINGS: Lower chest: No acute abnormality. Hepatobiliary: No focal liver abnormality is seen. Hepatic steatosis. No gallstones, gallbladder wall thickening, or biliary dilatation. Pancreas: Unremarkable. No pancreatic ductal dilatation or surrounding inflammatory changes. Spleen: Normal in size without focal abnormality. Adrenals/Urinary Tract: Adrenal glands are unremarkable. Kidneys are normal, without renal calculi, focal lesion, or hydronephrosis. Bladder is unremarkable. Stomach/Bowel: Stomach is within normal limits.  Appendix appears normal. No evidence of bowel wall thickening, distention, or inflammatory changes. Vascular/Lymphatic: Scattered calcific atherosclerosis. No enlarged abdominal or pelvic lymph nodes. Reproductive: Status post hysterectomy. Other: No abdominal wall hernia or abnormality. No abdominopelvic ascites. Musculoskeletal: No acute or significant osseous findings. IMPRESSION: 1. No non-contrast CT findings to explain left flank pain or nausea. No evidence of urinary tract calculus  or hydronephrosis. 2.  Hepatic steatosis. 3.  Other chronic and incidental findings as detailed above. Electronically Signed   By: Eddie Candle M.D.   On: 07/22/2018 12:32    Procedures Procedures (including critical care time)  Medications Ordered in ED Medications  ketorolac (TORADOL) 30 MG/ML injection 30 mg (30 mg Intramuscular Given 07/22/18 1236)     Initial Impression / Assessment and Plan / ED Course  I have reviewed the triage vital signs and the nursing notes.  Pertinent labs & imaging results that were available during my care of the patient were reviewed by me and considered in my medical decision making (see chart for details).     Laboratory tests are unremarkable.  No evidence of hematuria or urinary tract infection days urinalysis.  CT scan does not show any acute findings.  No evidence of ureteral stone.  No renal mass.  No vascular abnormality.  I suspect her symptoms are most likely musculoskeletal in nature.  Plan on DC home with NSAIDs and muscle relaxants.  Discussed outpatient follow-up.  Final Clinical Impressions(s) / ED Diagnoses   Final diagnoses:  Flank pain    ED Discharge Orders         Ordered    naproxen (NAPROSYN) 500 MG tablet  2 times daily with meals     07/22/18 1306    cyclobenzaprine (FLEXERIL) 10 MG tablet  2 times daily PRN     07/22/18 1306           Dorie Rank, MD 07/22/18 1307

## 2018-07-23 ENCOUNTER — Encounter: Payer: Self-pay | Admitting: Family

## 2018-07-23 ENCOUNTER — Telehealth: Payer: Self-pay | Admitting: Family

## 2018-07-23 ENCOUNTER — Ambulatory Visit (INDEPENDENT_AMBULATORY_CARE_PROVIDER_SITE_OTHER): Payer: BLUE CROSS/BLUE SHIELD | Admitting: Family

## 2018-07-23 ENCOUNTER — Ambulatory Visit (HOSPITAL_BASED_OUTPATIENT_CLINIC_OR_DEPARTMENT_OTHER)
Admission: RE | Admit: 2018-07-23 | Discharge: 2018-07-23 | Disposition: A | Discharge status: Home / Self Care | Payer: BLUE CROSS/BLUE SHIELD | Source: Ambulatory Visit | Attending: Family | Admitting: Family

## 2018-07-23 DIAGNOSIS — M545 Low back pain, unspecified: Secondary | ICD-10-CM

## 2018-07-23 DIAGNOSIS — N3281 Overactive bladder: Secondary | ICD-10-CM | POA: Diagnosis not present

## 2018-07-23 DIAGNOSIS — M47816 Spondylosis without myelopathy or radiculopathy, lumbar region: Secondary | ICD-10-CM | POA: Diagnosis not present

## 2018-07-23 LAB — URINE CULTURE
MICRO NUMBER:: 491838
Result:: NO GROWTH
SPECIMEN QUALITY:: ADEQUATE

## 2018-07-23 MED ORDER — OXYBUTYNIN CHLORIDE 5 MG PO TABS: 5.0000 mg | ORAL_TABLET | Freq: Two times a day (BID) | ORAL | 2 refills | Status: DC

## 2018-07-23 MED ORDER — METHYLPREDNISOLONE 4 MG PO TBPK: ORAL_TABLET | ORAL | 0 refills | Status: DC

## 2018-07-23 MED FILL — METHYLPREDNISOLONE 4 MG TBP: 4 | 6 days supply | Qty: 21 | Fill #0

## 2018-07-23 MED FILL — OXYBUTYNIN CHLORIDE 5 MG TA: 5 | 30 days supply | Qty: 60 | Fill #0

## 2018-07-23 NOTE — Telephone Encounter (Signed)
Reviewed with pt at her virtual visit today.

## 2018-07-23 NOTE — Telephone Encounter (Signed)
See mychart.  

## 2018-07-23 NOTE — Telephone Encounter (Signed)
Please contact pt and let her know that I reviewed her ER notes and lab work from our office.  Urine culture is negative for infection.  Based on what was done in the ER it sounds like her pain is most likely musculoskeletal.  I recommend that she stop cipro and start naproxen/flexeril as prescribed by the ED.

## 2018-07-23 NOTE — Progress Notes (Signed)
Virtual Visit via failed Video Note  I connected with Jessica Kemp on 07/23/18 at  9:40 AM EDT by a video enabled telemedicine application and verified that I am speaking with the correct person using two identifiers. This visit type was conducted due to national recommendations for restrictions regarding the COVID-19 Pandemic (e.g. social distancing).  This format is felt to be most appropriate for this patient at this time. Due to some technical issues with our user platform [email or web address] we were unable to connect via video and    I discussed the limitations of evaluation and management by telemedicine and the availability of in person appointments. The patient expressed understanding and agreed to proceed.  Only the patient and myself were on today's video visit. The patient was at home and I was in my office at the time of today's visit.   History of Present Illness:  Patient is a 53 yr old female who presents today for ER follow up. We saw her on 07/21/18 for a video visit for L sided flank pain, frequency and urgency. She was started on empiric cipro. Urine culture did not grow bacteria.  She then presented to the ED on 5/21 with worsening left sided low back pain and underwent a CT scan which was unremarkable with the exception of fatty liver. No kidney stones were noted.  She was treated with naproxen and flexeril by the ED for likely musculoskeletal pain. She reports some improvement in her pain with this medication but notes that pain is still "constant no matter what I do."    Reports that she has missed a week of work due to the pain. She would like to be written out through 6/1 with the plan of returning sooner if symptoms improve.   Reports that her urine symptoms are intermittent.  Still has frequency, urgency and sometimes doesn't make it to the restroom due to the urgency.     Observations/Objective:  Gen: Awake, alert, no acute distress Resp: Breathing is even and non-labored Psych:  calm/pleasant demeanor Neuro: Alert and Oriented x 3, + facial symmetry, speech is clear.   Assessment and Plan:  Low back pain- uncontrolled. Will give rx for medrol dose pak.  Continue flexeril prn. Obtain lumbar x-ray to further evaluate. She is advised to call if symptoms worsen or if not improved in 1 week.   Overactive bladder-plan to d/c cipro since culture is negative.  Will give trial of oxybutynin.  If no improvement with these symptoms plan referral to urology.   Follow Up Instructions:    I discussed the assessment and treatment plan with the patient. The patient was provided an opportunity to ask questions and all were answered. The patient agreed with the plan and demonstrated an understanding of the instructions.   The patient was advised to call back or seek an in-person evaluation if the symptoms worsen or if the condition fails to improve as anticipated.    Nance Pear, NP

## 2018-07-27 NOTE — Telephone Encounter (Signed)
Spoke to patient. She reports that today is the first day that her back is feeling better. She plans to complete steroid taper and then call us if symptoms do not continue to improve.

## 2018-07-29 MED ORDER — TRAMADOL HCL 50 MG PO TABS: 50.0000 mg | ORAL_TABLET | Freq: Three times a day (TID) | ORAL | 0 refills | Status: AC | PRN

## 2018-07-29 MED FILL — traMADol HCL 50 MG TABS: 50 | 6 days supply | Qty: 20 | Fill #0

## 2018-07-29 NOTE — Addendum Note (Signed)
Addended by: Debbrah Alar on: 07/29/2018 01:39 PM   Modules accepted: Orders

## 2018-07-30 DIAGNOSIS — Z0279 Encounter for issue of other medical certificate: Secondary | ICD-10-CM

## 2018-08-02 ENCOUNTER — Telehealth: Payer: Self-pay

## 2018-08-02 NOTE — Telephone Encounter (Signed)
Forms given to Kennyth Lose to add charge sheet and given to [facility] when ready to contact patient for pick up.

## 2018-08-02 NOTE — Telephone Encounter (Signed)
Patient wants the completed forms mailed to her home address as soon as possible please. Would like a call to notify her once it's mailed.

## 2018-08-02 NOTE — Telephone Encounter (Signed)
-----   Message from Debbrah Alar, NP sent at 07/30/2018  5:05 PM EDT ----- I am leaving her FMLA on your desk. Could you please copy it and leave one at the front desk for her? Also, please submit a $20 charge form for the FMLA.  Thanks,  [employer]

## 2018-08-03 NOTE — Telephone Encounter (Signed)
Pt called back stating that she would like her FMLA paper faxed to [phone]

## 2018-08-03 NOTE — Telephone Encounter (Signed)
Anderson Malta -- below note says form was placed on your desk. Can you forward to pt at below #?

## 2018-08-06 NOTE — Telephone Encounter (Signed)
Called pt and LMOVM letting her know the original FMLA pw will be put in our outgoing mail to her today.

## 2018-08-07 MED ORDER — TRAMADOL HCL 50 MG PO TABS: 50.0000 mg | ORAL_TABLET | Freq: Three times a day (TID) | ORAL | 0 refills | Status: AC | PRN

## 2018-08-07 NOTE — Addendum Note (Signed)
Addended by: Debbrah Alar on: 08/07/2018 09:49 AM   Modules accepted: Orders

## 2018-08-07 NOTE — Telephone Encounter (Signed)
[name], this patient needs a face to face visit for back pain.  Can you please call her? Ok to set up with another provider if our schedules do not match up. tks.

## 2018-08-09 ENCOUNTER — Telehealth: Payer: Self-pay | Admitting: Family

## 2018-08-09 NOTE — Telephone Encounter (Signed)
Called patient to arrange in person follow up but no answer, left message.

## 2018-08-09 NOTE — Telephone Encounter (Signed)
Called pt left msg to call back for an appt today per Melissa. Medication refill

## 2018-08-10 ENCOUNTER — Encounter: Payer: Self-pay | Admitting: Family

## 2018-08-10 ENCOUNTER — Other Ambulatory Visit: Payer: Self-pay

## 2018-08-10 ENCOUNTER — Ambulatory Visit: Payer: BC Managed Care – PPO | Admitting: Family

## 2018-08-11 NOTE — Telephone Encounter (Signed)
Copied from [place] [id number]. Topic: Quick Communication - Appointment Cancellation >> Aug 10, 2018  9:29 AM Richardo Priest, NT wrote: Patient called to cancel appointment scheduled for 6/9. Patient has not rescheduled their appointment.  Route to department's PEC pool.

## 2018-08-16 NOTE — Telephone Encounter (Signed)
Hi Rod Holler, just checking that her FMLA update was sent? Thanks.

## 2020-01-05 DIAGNOSIS — M545 Low back pain, unspecified: Secondary | ICD-10-CM | POA: Diagnosis not present

## 2020-01-05 DIAGNOSIS — M542 Cervicalgia: Secondary | ICD-10-CM | POA: Diagnosis not present

## 2020-01-05 DIAGNOSIS — M25551 Pain in right hip: Secondary | ICD-10-CM | POA: Diagnosis not present

## 2020-03-07 ENCOUNTER — Encounter: Payer: Self-pay | Admitting: Neurology

## 2020-03-08 ENCOUNTER — Other Ambulatory Visit: Payer: Self-pay

## 2020-03-09 ENCOUNTER — Other Ambulatory Visit: Payer: Self-pay

## 2020-03-09 ENCOUNTER — Ambulatory Visit: Payer: BC Managed Care – PPO | Admitting: Family

## 2020-03-09 ENCOUNTER — Encounter: Payer: Self-pay | Admitting: Family

## 2020-03-09 ENCOUNTER — Other Ambulatory Visit: Payer: Self-pay | Admitting: Family

## 2020-03-09 VITALS — BP 145/77 | HR 71 | Temp 97.8°F | Resp 16 | Wt 194.0 lb

## 2020-03-09 DIAGNOSIS — M5412 Radiculopathy, cervical region: Secondary | ICD-10-CM | POA: Diagnosis not present

## 2020-03-09 DIAGNOSIS — R030 Elevated blood-pressure reading, without diagnosis of hypertension: Secondary | ICD-10-CM

## 2020-03-09 DIAGNOSIS — F419 Anxiety disorder, unspecified: Secondary | ICD-10-CM

## 2020-03-09 DIAGNOSIS — M5416 Radiculopathy, lumbar region: Secondary | ICD-10-CM

## 2020-03-09 MED ORDER — METHYLPREDNISOLONE 4 MG PO TBPK: ORAL_TABLET | ORAL | 0 refills | Status: DC

## 2020-03-09 MED ORDER — FLUOXETINE HCL 10 MG PO CAPS: 10.0000 mg | ORAL_CAPSULE | Freq: Every day | ORAL | 1 refills | Status: DC

## 2020-03-09 MED FILL — FLUoxetine HCL 10 MG CAPS: 10 | 30 days supply | Qty: 30 | Fill #0

## 2020-03-09 MED FILL — METHYLPREDNISOLONE 4 MG TBP: 4 | 5 days supply | Qty: 21 | Fill #0

## 2020-03-09 NOTE — Patient Instructions (Signed)
Please begin medrol dose pak and prednisone 10mg .

## 2020-03-09 NOTE — Progress Notes (Signed)
Subjective:    Patient ID: Jessica Kemp, female    DOB: Nov 15, 1965, 55 y.o.   MRN: [id number]  HPI  Patient is a 55 yr old female who presents today with chief complaint of right arm/right hand numbness. This has been present for months but over the last week the pain has been keeping her from sleeping. She saw ortho ([facility]) on 11/3 and was told that she DDD in her c-spine. They scheduled a nerve study for 2/8.    Wt Readings from Last 3 Encounters:  03/09/20 194 lb (88 kg)  07/22/18 190 lb (86.2 kg)  04/06/18 189 lb (85.7 kg)   Anxiety- has been bad "the last month."  Moved recently.  Anxiety is impacting the quality of her work and quality of life. She is requesting treatment for her anxiety.   Review of Systems See HPI  Past Medical History:  Diagnosis Date  . Arthritis Gerd  . Blood in the stool   . Depression   . GERD (gastroesophageal reflux disease) depression  . Hypothyroidism    25 years ago  . Incontinence in female 2012     Social History   Socioeconomic History  . Marital status: Divorced    Spouse name: Not on file  . Number of children: 3  . Years of education: Not on file  . Highest education level: Not on file  Occupational History  . Occupation: Medical illustrator  Tobacco Use  . Smoking status: Former Smoker    Quit date: 03/03/2015    Years since quitting: 5.0  . Smokeless tobacco: Never Used  Vaping Use  . Vaping Use: Former  . Start date: 03/04/2015  . Quit date: 04/04/2015  Substance and Sexual Activity  . Alcohol use: No  . Drug use: Yes    Types: Marijuana  . Sexual activity: Not Currently  Other Topics Concern  . 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    Social Determinants of Health   Financial Resource Strain: Not on file  Food Insecurity: Not on file   Transportation Needs: Not on file  Physical Activity: Not on file  Stress: Not on file  Social Connections: Not on file  Intimate Partner Violence: Not on file    Past Surgical History:  Procedure Laterality Date  . CESAREAN SECTION  1995  . PARTIAL HYSTERECTOMY  2012    Family History  Problem Relation Age of Onset  . Leukemia Mother   . Diabetes Mother   . Hyperlipidemia Mother   . Stroke Mother   . Arthritis Father   . Diabetes Father   . Hypertension Father   . Stroke Father   . Alcohol abuse Brother   . Arthritis Brother   . Hypertension Brother   . Heart disease Maternal Grandmother   . Heart attack Maternal Grandmother   . Heart attack Maternal Grandfather   . Cancer Paternal Grandmother   . Leukemia Paternal Grandfather   . Lymphoma Son   . Colon cancer Neg Hx   . Esophageal cancer Neg Hx   . Stomach cancer Neg Hx   . Rectal cancer Neg Hx     No Known Allergies  Current Outpatient Medications on File Prior to Visit  Medication Sig Dispense Refill  . acetaminophen (TYLENOL)  500 MG tablet Take 500 mg by mouth every 6 (six) hours as needed.     No current facility-administered medications on file prior to visit.    BP (!) 145/77 (BP Location: Right Arm, Patient Position: Sitting, Cuff Size: Large)   Pulse 71   Temp 97.8 F (36.6 C) (Oral)   Resp 16   Wt 194 lb (88 kg)   SpO2 99%   BMI 35.48 kg/m       Objective:   Physical Exam Constitutional:      Appearance: She is well-developed and well-nourished.  Cardiovascular:     Rate and Rhythm: Normal rate and regular rhythm.     Heart sounds: Normal heart sounds. No murmur heard.   Pulmonary:     Effort: Pulmonary effort is normal. No respiratory distress.     Breath sounds: Normal breath sounds. No wheezing.  Neurological:     Deep Tendon Reflexes:     Reflex Scores:      Bicep reflexes are 1+ on the right side and 1+ on the left side.      Brachioradialis reflexes are 1+ on the right side  and 1+ on the left side.      Patellar reflexes are 3+ on the right side and 3+ on the left side.    Comments: Bilateral UE/LE strength is 5/5  Psychiatric:        Mood and Affect: Mood and affect normal.        Behavior: Behavior normal.        Thought Content: Thought content normal.        Judgment: Judgment normal.           Assessment & Plan:  Cervical radiculopathy- pt describes numbness in a right C7 and C8 dermatome. Will treat with Medrol Dosepak.  She is advised to keep her upcoming appointment for nerve conduction study as well as follow-up with orthopedics.  Lumbar radiculopathy-she has area of numbness in the right lateral thigh consistent with L4 radiculopathy.  This should also improve with Medrol Dosepak.  Anxiety-uncontrolled.  She tried Prozac once very briefly but did not continue.  She did tolerate it without difficulty.  Will initiate Prozac 10 mg.   We discussed common side effects such as nausea, drowsiness and weight gain.  She is instructed to discontinue medication go directly to ED if this occurs.  Pt verbalizes understanding.  Plan follow up in 1 month to evaluate progress.    Elevated blood pressure reading-  Could be related to recent weight gain. Plan to repeat at her follow up appointment. If still elevated will consider addition of a BP med.   BP Readings from Last 3 Encounters:  03/09/20 (!) 145/77  07/22/18 138/80  04/06/18 132/81    Wt Readings from Last 3 Encounters:  03/09/20 194 lb (88 kg)  07/22/18 190 lb (86.2 kg)  04/06/18 189 lb (85.7 kg)    This visit occurred during the SARS-CoV-2 public health emergency.  Safety protocols were in place, including screening questions prior to the visit, additional usage of staff PPE, and extensive cleaning of exam room while observing appropriate contact time as indicated for disinfecting solutions.

## 2020-03-14 ENCOUNTER — Encounter: Payer: Self-pay | Admitting: Family

## 2020-03-14 ENCOUNTER — Other Ambulatory Visit: Payer: Self-pay | Admitting: Family

## 2020-03-14 MED ORDER — MELOXICAM 7.5 MG PO TABS: 7.5000 mg | ORAL_TABLET | Freq: Every day | ORAL | 0 refills | Status: DC | PRN

## 2020-03-14 MED FILL — MELOXICAM 7.5 MG TABLET: 7.5 | 30 days supply | Qty: 30 | Fill #0

## 2020-03-16 ENCOUNTER — Encounter: Payer: Self-pay | Admitting: Family

## 2020-03-16 ENCOUNTER — Other Ambulatory Visit: Payer: Self-pay

## 2020-03-16 DIAGNOSIS — R202 Paresthesia of skin: Secondary | ICD-10-CM

## 2020-04-03 ENCOUNTER — Ambulatory Visit (INDEPENDENT_AMBULATORY_CARE_PROVIDER_SITE_OTHER): Payer: BC Managed Care – PPO | Admitting: Family

## 2020-04-03 ENCOUNTER — Telehealth: Payer: Self-pay | Admitting: Family

## 2020-04-03 ENCOUNTER — Other Ambulatory Visit: Payer: Self-pay

## 2020-04-03 ENCOUNTER — Encounter: Payer: Self-pay | Admitting: Family

## 2020-04-03 VITALS — BP 139/86 | HR 68 | Temp 98.5°F | Resp 16 | Ht 61.0 in | Wt 196.6 lb

## 2020-04-03 DIAGNOSIS — R635 Abnormal weight gain: Secondary | ICD-10-CM | POA: Diagnosis not present

## 2020-04-03 DIAGNOSIS — Z23 Encounter for immunization: Secondary | ICD-10-CM

## 2020-04-03 DIAGNOSIS — R739 Hyperglycemia, unspecified: Secondary | ICD-10-CM | POA: Diagnosis not present

## 2020-04-03 DIAGNOSIS — M5412 Radiculopathy, cervical region: Secondary | ICD-10-CM

## 2020-04-03 DIAGNOSIS — E785 Hyperlipidemia, unspecified: Secondary | ICD-10-CM

## 2020-04-03 DIAGNOSIS — Z0000 Encounter for general adult medical examination without abnormal findings: Secondary | ICD-10-CM | POA: Diagnosis not present

## 2020-04-03 DIAGNOSIS — N6452 Nipple discharge: Secondary | ICD-10-CM

## 2020-04-03 LAB — LIPID PANEL
Cholesterol: 304 mg/dL — ABNORMAL HIGH (ref 0–200)
HDL: 36.1 mg/dL — ABNORMAL LOW (ref >39.00)
Total CHOL/HDL Ratio: 8
Triglycerides: 459 mg/dL — ABNORMAL HIGH (ref 0.0–149.0)

## 2020-04-03 LAB — COMPREHENSIVE METABOLIC PANEL
ALT: 25 U/L (ref 0–35)
AST: 15 U/L (ref 0–37)
Albumin: 4.2 g/dL (ref 3.5–5.2)
Alkaline Phosphatase: 73 U/L (ref 39–117)
BUN: 13 mg/dL (ref 6–23)
CO2: 26 mEq/L (ref 19–32)
Calcium: 9.4 mg/dL (ref 8.4–10.5)
Chloride: 104 mEq/L (ref 96–112)
Creatinine, Ser: 0.7 mg/dL (ref 0.40–1.20)
GFR: 97.43 mL/min (ref >60.00)
Glucose, Bld: 106 mg/dL — ABNORMAL HIGH (ref 70–99)
Potassium: 4.2 mEq/L (ref 3.5–5.1)
Sodium: 138 mEq/L (ref 135–145)
Total Bilirubin: 0.4 mg/dL (ref 0.2–1.2)
Total Protein: 6.7 g/dL (ref 6.0–8.3)

## 2020-04-03 LAB — TSH: TSH: 3.42 u[IU]/mL (ref 0.35–4.50)

## 2020-04-03 LAB — LDL CHOLESTEROL, DIRECT: Direct LDL: 169 mg/dL

## 2020-04-03 NOTE — Progress Notes (Signed)
Subjective:    Patient ID: Jessica Kemp, female    DOB: Feb 23, 1966, 55 y.o.   MRN: [id number]  HPI  Patient presents today for complete physical.  Immunizations: flu shot today  Diet: needs improvement Wt Readings from Last 3 Encounters:  04/03/20 196 lb 9.6 oz (89.2 kg)  03/09/20 194 lb (88 kg)  07/22/18 190 lb (86.2 kg)   BP Readings from Last 3 Encounters:  04/03/20 139/86  03/09/20 (!) 145/77  07/22/18 138/80  Exercise: walks her dog daily a few miles Colonoscopy: due 2025 Pap Smear: hysterectomy Mammogram: due  Reports that she had discharge from the right breast a few months back.  She noted clear discharge.  This has resolved.  She tried to schedule her routine mammogram appointment but was told she need to follow-up with PCP for diagnostic mammogram ordered.  Depression- improved on prozac.  Denies any current side effects.  Review of Systems  Constitutional: Positive for unexpected weight change.  HENT: Negative for hearing loss and rhinorrhea.   Eyes: Negative for visual disturbance.  Respiratory: Negative for cough and shortness of breath.   Cardiovascular: Negative for chest pain.  Gastrointestinal: Negative for constipation and diarrhea.       C/o hemorrhoids- uses preparation H.   Genitourinary: Negative for dysuria, frequency and hematuria.  Musculoskeletal: Positive for arthralgias (knees, hands).  Skin: Negative for rash.       Has appointment with dermatology for skin check  Neurological: Positive for headaches (few times a week, uses tylenol prn with good relief).  Hematological:       Reports right neck is always fuller than left  Psychiatric/Behavioral:       Stable mood.    Past Medical History:  Diagnosis Date  . Arthritis Gerd  . Blood in the stool   . Depression   . GERD (gastroesophageal reflux disease) depression  . Hypothyroidism    25 years ago  . Incontinence in female 2012     Social History   Socioeconomic History  .  Marital status: Divorced    Spouse name: Not on file  . Number of children: 3  . Years of education: Not on file  . Highest education level: Not on file  Occupational History  . Occupation: Medical illustrator  Tobacco Use  . Smoking status: Former Smoker    Quit date: 03/03/2015    Years since quitting: 5.0  . Smokeless tobacco: Never Used  Vaping Use  . Vaping Use: Former  . Start date: 03/04/2015  . Quit date: 04/04/2015  Substance and Sexual Activity  . Alcohol use: No  . Drug use: Yes    Types: Marijuana  . Sexual activity: Not Currently  Other Topics Concern  . 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    Social Determinants of Health   Financial Resource Strain: Not on file  Food Insecurity: Not on file  Transportation Needs: Not on file  Physical Activity: Not on file  Stress: Not on file  Social Connections: Not on file  Intimate Partner Violence: Not on file    Past Surgical History:  Procedure Laterality Date  . CESAREAN SECTION  1995  . PARTIAL HYSTERECTOMY  2012    Family History  Problem Relation Age of Onset  .  Leukemia Mother   . Diabetes Mother   . Hyperlipidemia Mother   . Stroke Mother   . Arthritis Father   . Diabetes Father   . Hypertension Father   . Stroke Father   . Alcohol abuse Brother   . Arthritis Brother   . Hypertension Brother   . Heart disease Maternal Grandmother   . Heart attack Maternal Grandmother   . Heart attack Maternal Grandfather   . Cancer Paternal Grandmother   . Leukemia Paternal Grandfather   . Lymphoma Son   . Colon cancer Neg Hx   . Esophageal cancer Neg Hx   . Stomach cancer Neg Hx   . Rectal cancer Neg Hx     No Known Allergies  Current Outpatient Medications on File Prior to Visit  Medication Sig Dispense Refill  . FLUoxetine (PROZAC)  10 MG capsule Take 1 capsule (10 mg total) by mouth daily. 30 capsule 1  . meloxicam (MOBIC) 7.5 MG tablet Take 1 tablet (7.5 mg total) by mouth daily as needed for pain. 30 tablet 0   No current facility-administered medications on file prior to visit.    BP 139/86 (BP Location: Right Arm, Patient Position: Sitting, Cuff Size: Small)   Pulse 68   Temp 98.5 F (36.9 C) (Oral)   Resp 16   Ht 5\' 1"  (1.549 m)   Wt 196 lb 9.6 oz (89.2 kg)   SpO2 99%   BMI 37.15 kg/m       Objective:   Physical Exam  Physical Exam  Constitutional: She is oriented to person, place, and time. She appears well-developed and well-nourished. No distress.  HENT:  Head: Normocephalic and atraumatic.  Right Ear: Tympanic membrane and ear canal normal.  Left Ear: Tympanic membrane and ear canal normal.  Mouth/Throat: Not examined- pt wearing mask Eyes: Pupils are equal, round, and reactive to light. No scleral icterus.  Neck: Normal range of motion. No thyromegaly present.  Cardiovascular: Normal rate and regular rhythm.   No murmur heard. Pulmonary/Chest: Effort normal and breath sounds normal. No respiratory distress. He has no wheezes. She has no rales. She exhibits no tenderness.  Abdominal: Soft. Bowel sounds are normal. She exhibits no distension and no mass. There is no tenderness. There is no rebound and no guarding.  Musculoskeletal: She exhibits no edema.  Lymphadenopathy:    She has no cervical adenopathy.  Neurological: She is alert and oriented to person, place, and time. She has normal patellar reflexes. She exhibits normal muscle tone. Coordination normal.  Skin: Skin is warm and dry.  Psychiatric: She has a normal mood and affect. Her behavior is normal. Judgment and thought content normal.  Breasts: Examined lying Right: Without masses, retractions, discharge or axillary adenopathy.  Left: Without masses, retractions, discharge or axillary adenopathy.  Pelvic: deferred            Assessment & Plan:  Preventive care-we discussed healthy diet, exercise, and weight loss.  Due to her report of discharge from the right breast will order a bilateral diagnostic mammogram and right breast ultrasound.  Flu shot today.  She plans to get her Moderiba booster shot downstairs at the pharmacy later this morning.  Tetanus is also due, will hold off on this since she is getting the other 2 vaccines today.  Plan to give tetanus next visit.  Colonoscopy is up-to-date.  This visit occurred during the SARS-CoV-2 public health emergency.  Safety protocols were in place, including screening questions prior to the visit,  additional usage of staff PPE, and extensive cleaning of exam room while observing appropriate contact time as indicated for disinfecting solutions.           Assessment & Plan:

## 2020-04-03 NOTE — Telephone Encounter (Signed)
Please advise pt that her cholesterol is quite high and her sugar/triglycerides are mildly elevated.  I would recommend that she add atorvastatin 20mg  once daily, work on reduced sugar/low fat diet, exercise and weight loss. Follow up lipid panel in 3 months fasting.   Thyroid, kidney function, electrolytes and liver function are normal.

## 2020-04-03 NOTE — Patient Instructions (Addendum)
Please download myfitness pal and set up a plan with goal weight loss of 1-2 pounds/week. Please complete lab work prior to leaving.  Continue regular exercise.

## 2020-04-05 ENCOUNTER — Other Ambulatory Visit: Payer: Self-pay | Admitting: Family

## 2020-04-05 NOTE — Telephone Encounter (Signed)
Lvm for patient to call back to go over results

## 2020-04-06 ENCOUNTER — Other Ambulatory Visit: Payer: Self-pay | Admitting: Family

## 2020-04-06 MED ORDER — MELOXICAM 7.5 MG PO TABS: 7.5000 mg | ORAL_TABLET | Freq: Every day | ORAL | 1 refills | Status: DC | PRN

## 2020-04-06 MED FILL — MELOXICAM 7.5 MG TABLET: 7.5 | 30 days supply | Qty: 30 | Fill #0

## 2020-04-09 NOTE — Telephone Encounter (Signed)
Lvm again today for patient to call about her results

## 2020-04-10 ENCOUNTER — Encounter: Payer: Self-pay | Admitting: Family

## 2020-04-10 ENCOUNTER — Other Ambulatory Visit: Payer: Self-pay

## 2020-04-10 ENCOUNTER — Ambulatory Visit (INDEPENDENT_AMBULATORY_CARE_PROVIDER_SITE_OTHER): Payer: BC Managed Care – PPO | Admitting: Neurology

## 2020-04-10 ENCOUNTER — Ambulatory Visit: Payer: BC Managed Care – PPO | Attending: Internal Medicine

## 2020-04-10 DIAGNOSIS — Z23 Encounter for immunization: Secondary | ICD-10-CM

## 2020-04-10 DIAGNOSIS — R202 Paresthesia of skin: Secondary | ICD-10-CM | POA: Diagnosis not present

## 2020-04-10 DIAGNOSIS — G5603 Carpal tunnel syndrome, bilateral upper limbs: Secondary | ICD-10-CM

## 2020-04-10 DIAGNOSIS — E785 Hyperlipidemia, unspecified: Secondary | ICD-10-CM

## 2020-04-10 MED ORDER — ATORVASTATIN CALCIUM 20 MG PO TABS: 20.0000 mg | ORAL_TABLET | Freq: Every day | ORAL | 3 refills | Status: DC

## 2020-04-10 MED FILL — FLUoxetine HCL 10 MG CAPS: 10 | 30 days supply | Qty: 30 | Fill #1

## 2020-04-10 MED FILL — MODERNA COVID-19 VACCINE 10: 100 | 28 days supply | Qty: 0 | Fill #0

## 2020-04-10 MED FILL — ATORVASTATIN CALCIUM 20 MG: 20 | 90 days supply | Qty: 90 | Fill #0

## 2020-04-10 NOTE — Telephone Encounter (Signed)
Patient advised of results, provider's comments and new prescription. She agrees with plan and is working on diet using the My fitness plan app.  Prescription was sent and she was scheduled to come back for lipid 07-02-20.  She wanted to let provider know she will like to get MRI.

## 2020-04-10 NOTE — Addendum Note (Signed)
Addended by: Debbrah Alar on: 04/10/2020 02:19 PM   Modules accepted: Orders

## 2020-04-10 NOTE — Telephone Encounter (Signed)
Spoke to pt. States she is still having neck pain, RUE pain/numbness/weakness. Had nerve conduction study today. Has already tried NSAIDS with only temporary improvement. Would like to proceed with MRI of the C-spine.  Order placed.

## 2020-04-10 NOTE — Progress Notes (Signed)
   Covid-19 Vaccination Clinic  Name:  Jessica Kemp    MRN: [id number] DOB: [birth year]  04/10/2020  [name] was observed post Covid-19 immunization for 15 minutes without incident. She was provided with Vaccine Information Sheet and instruction to access the V-Safe system.   Vaccinated By: Allayne Butcher  [name] was instructed to call 911 with any severe reactions post vaccine: Marland Kitchen Difficulty breathing  . Swelling of face and throat  . A fast heartbeat  . A bad rash all over body  . Dizziness and weakness   Immunizations Administered    Name Date Dose VIS Date Route   Moderna Covid-19 Booster Vaccine 04/10/2020  1:41 PM 0.25 mL 12/21/2019 Intramuscular   Manufacturer: Moderna   Lot: [id number]   [place]: [id number]

## 2020-04-10 NOTE — Procedures (Signed)
[facility]  [address]  [place], [place] [address] Tel: [phone] Fax:  [phone] Test Date:  04/10/2020  Patient: Jessica Kemp DOB: 1965-05-30 Physician: Narda Amber, DO  Sex: Female Height: 5\' 1"  Ref Phys: Faythe Casa, MD  ID#: [id number]   Technician:    Patient Complaints: This is a 55 year old female referred for evaluation of bilateral hand numbness and tingling.  NCV & EMG Findings: Extensive electrodiagnostic testing of the right upper extremity and additional studies of the left shows:  1. Right median sensory response shows prolonged latency (4.4 ms).  Left mixed palmer shows prolonged latency.  Left median and bilateral ulnar sensory responses are within normal limits. 2. Bilateral median motor responses show prolonged latency (R6.0, L4.2 ms).  Of note, there is evidence of a median-to-ulnar crossover in the forearm as seen by a motor response stimulating at the ulnar-wrist and recording at the abductor pollicis brevis muscle.  Bilateral ulnar motor responses are within normal limits. 3. Chronic motor axonal loss changes are seen affecting the right abductor pollicis brevis muscle, which is not present on the left.  Impression: 1. Bilateral median neuropathy at or distal to the wrist, consistent with a clinical diagnosis of carpal tunnel syndrome.  Overall, these findings are moderate in degree electrically and worse on the right. 2. Incidentally, there is evidence of a bilateral Martin-Gruber anastomoses, a normal anatomic variant.    ___________________________ Narda Amber, DO    Nerve Conduction Studies Anti Sensory Summary Table   Stim Site NR Peak (ms) Norm Peak (ms) P-T Amp (V) Norm P-T Amp  Left Median Anti Sensory (2nd Digit)  33C  Wrist    3.4 <3.6 36.9 >15  Right Median Anti Sensory (2nd Digit)  33C  Wrist    4.4 <3.6 15.9 >15  Left Ulnar Anti Sensory (5th Digit)  33C  Wrist    2.7 <3.1 39.1 >10  Right Ulnar Anti  Sensory (5th Digit)  33C  Wrist    2.7 <3.1 36.0 >10   Motor Summary Table   Stim Site NR Onset (ms) Norm Onset (ms) O-P Amp (mV) Norm O-P Amp Site1 Site2 Delta-0 (ms) Dist (cm) Vel (m/s) Norm Vel (m/s)  Left Median Motor (Abd Poll Brev)  33C  Wrist    4.2 <4.0 5.8 >6 Elbow Wrist 4.6 27.0 59 >50  Elbow    8.8  6.5  Ulnar-wrist crossover Elbow 5.1 0.0    Ulnar-wrist crossover    3.7  3.3         Right Median Motor (Abd Poll Brev)  33C  Wrist    6.0 <4.0 7.1 >6 Elbow Wrist 4.7 26.0 55 >50  Elbow    10.7  8.1  Ulnar-wrist crossover Elbow 7.1 0.0    Ulnar-wrist crossover    3.6  5.7         Left Ulnar Motor (Abd Dig Minimi)  33C  Wrist    2.4 <3.1 11.2 >7 B Elbow Wrist 3.1 20.0 65 >50  B Elbow    5.5  10.0  A Elbow B Elbow 1.6 10.0 63 >50  A Elbow    7.1  10.0         Right Ulnar Motor (Abd Dig Minimi)  33C  Wrist    2.4 <3.1 12.3 >7 B Elbow Wrist 3.4 21.0 62 >50  B Elbow    5.8  11.4  A Elbow B Elbow 1.8 10.0 56 >50  A Elbow  7.6  11.3          Comparison Summary Table   Stim Site NR Peak (ms) Norm Peak (ms) P-T Amp (V) Site1 Site2 Delta-P (ms) Norm Delta (ms)  Left Median/Ulnar Palm Comparison (Wrist - 8cm)  33C  Median Palm    2.1 <2.2 109.4 Median Palm Ulnar Palm 0.8   Ulnar Palm    1.3 <2.2 24.1       EMG   Side Muscle Ins Act Fibs Psw Fasc Number Recrt Dur Dur. Amp Amp. Poly Poly. Comment  Right 1stDorInt Nml Nml Nml Nml Nml Nml Nml Nml Nml Nml Nml Nml N/A  Right Abd Poll Brev Nml Nml Nml Nml 1- Rapid Some 1+ Some 1+ Some 1+ N/A  Right PronatorTeres Nml Nml Nml Nml Nml Nml Nml Nml Nml Nml Nml Nml N/A  Right Biceps Nml Nml Nml Nml Nml Nml Nml Nml Nml Nml Nml Nml N/A  Right Triceps Nml Nml Nml Nml Nml Nml Nml Nml Nml Nml Nml Nml N/A  Right Deltoid Nml Nml Nml Nml Nml Nml Nml Nml Nml Nml Nml Nml N/A  Left 1stDorInt Nml Nml Nml Nml Nml Nml Nml Nml Nml Nml Nml Nml N/A  Left Abd Poll Brev Nml Nml Nml Nml Nml Nml Nml Nml Nml Nml Nml Nml N/A  Left PronatorTeres Nml Nml Nml  Nml Nml Nml Nml Nml Nml Nml Nml Nml N/A  Left Biceps Nml Nml Nml Nml Nml Nml Nml Nml Nml Nml Nml Nml N/A  Left Triceps Nml Nml Nml Nml Nml Nml Nml Nml Nml Nml Nml Nml N/A  Left Deltoid Nml Nml Nml Nml Nml Nml Nml Nml Nml Nml Nml Nml N/A      Waveforms:

## 2020-04-11 ENCOUNTER — Telehealth: Payer: Self-pay | Admitting: Family

## 2020-04-11 DIAGNOSIS — M5412 Radiculopathy, cervical region: Secondary | ICD-10-CM

## 2020-04-11 NOTE — Telephone Encounter (Signed)
-----   Message from [facility] sent at 04/11/2020  9:31 AM EST ----- Peer to Peer [phone] order# [id number] Please contact [facility] to get approval.

## 2020-04-11 NOTE — Telephone Encounter (Signed)
Called [facility] to request peer to peer. They denied MRI because she has not done physical therapy.

## 2020-04-12 ENCOUNTER — Telehealth: Payer: Self-pay | Admitting: Family

## 2020-04-12 NOTE — Telephone Encounter (Signed)
FMLA paperwork faxed into front office  Put into [place] bin up front

## 2020-04-13 NOTE — Telephone Encounter (Signed)
Form placed in red folder for provider to review and fill out

## 2020-04-17 DIAGNOSIS — M79642 Pain in left hand: Secondary | ICD-10-CM | POA: Diagnosis not present

## 2020-04-17 DIAGNOSIS — M79641 Pain in right hand: Secondary | ICD-10-CM | POA: Diagnosis not present

## 2020-04-18 NOTE — Addendum Note (Signed)
Addended by: Debbrah Alar on: 04/18/2020 01:16 PM   Modules accepted: Orders

## 2020-04-19 ENCOUNTER — Encounter: Payer: Self-pay | Admitting: Family

## 2020-04-19 MED ORDER — ATORVASTATIN CALCIUM 20 MG PO TABS: 20.0000 mg | ORAL_TABLET | Freq: Every day | ORAL | 3 refills | Status: DC

## 2020-05-04 ENCOUNTER — Encounter: Payer: Self-pay | Admitting: Family

## 2020-05-04 ENCOUNTER — Other Ambulatory Visit: Payer: Self-pay

## 2020-05-04 MED ORDER — FLUOXETINE HCL 10 MG PO CAPS: 10.0000 mg | ORAL_CAPSULE | Freq: Every day | ORAL | 0 refills | Status: DC

## 2020-05-04 MED FILL — FLUoxetine HCL 10 MG CAPS: 10 | 90 days supply | Qty: 90 | Fill #0

## 2020-05-28 ENCOUNTER — Encounter: Payer: Self-pay | Admitting: Family

## 2020-05-28 DIAGNOSIS — M5412 Radiculopathy, cervical region: Secondary | ICD-10-CM

## 2020-05-28 DIAGNOSIS — G5600 Carpal tunnel syndrome, unspecified upper limb: Secondary | ICD-10-CM

## 2020-05-30 DIAGNOSIS — Z0279 Encounter for issue of other medical certificate: Secondary | ICD-10-CM

## 2020-06-08 ENCOUNTER — Other Ambulatory Visit (HOSPITAL_BASED_OUTPATIENT_CLINIC_OR_DEPARTMENT_OTHER): Payer: Self-pay

## 2020-06-08 MED ORDER — GABAPENTIN 100 MG PO CAPS
100.0000 mg | ORAL_CAPSULE | Freq: Three times a day (TID) | ORAL | 3 refills | Status: DC
  Filled 2020-06-08 – 2020-06-20 (×2): qty 90, 30d supply, fill #0

## 2020-06-11 ENCOUNTER — Other Ambulatory Visit (HOSPITAL_BASED_OUTPATIENT_CLINIC_OR_DEPARTMENT_OTHER): Payer: Self-pay

## 2020-06-18 ENCOUNTER — Other Ambulatory Visit (HOSPITAL_BASED_OUTPATIENT_CLINIC_OR_DEPARTMENT_OTHER): Payer: Self-pay

## 2020-06-20 ENCOUNTER — Other Ambulatory Visit (HOSPITAL_BASED_OUTPATIENT_CLINIC_OR_DEPARTMENT_OTHER): Payer: Self-pay

## 2020-06-27 ENCOUNTER — Telehealth: Payer: Self-pay | Admitting: Family

## 2020-06-27 DIAGNOSIS — G5603 Carpal tunnel syndrome, bilateral upper limbs: Secondary | ICD-10-CM

## 2020-06-27 NOTE — Telephone Encounter (Signed)
Could you please call Dr. Angus Palms office and ask them where they do their outpatient surgery? This patient is looking to do surgery at a [facility] facility.

## 2020-06-27 NOTE — Telephone Encounter (Signed)
Per receptionist at [facility], Dr Caralyn Guile does small procedures in office and others at [facility]. She said "it depends on the case"

## 2020-07-02 ENCOUNTER — Other Ambulatory Visit: Payer: BC Managed Care – PPO

## 2020-07-03 ENCOUNTER — Ambulatory Visit (INDEPENDENT_AMBULATORY_CARE_PROVIDER_SITE_OTHER): Payer: BC Managed Care – PPO | Admitting: Orthopaedic Surgery

## 2020-07-03 ENCOUNTER — Other Ambulatory Visit: Payer: Self-pay

## 2020-07-03 DIAGNOSIS — G5601 Carpal tunnel syndrome, right upper limb: Secondary | ICD-10-CM

## 2020-07-03 DIAGNOSIS — M542 Cervicalgia: Secondary | ICD-10-CM

## 2020-07-03 DIAGNOSIS — G5602 Carpal tunnel syndrome, left upper limb: Secondary | ICD-10-CM

## 2020-07-03 NOTE — Progress Notes (Signed)
Office Visit Note   Patient: Jessica Kemp           Date of Birth: 1965/04/10           MRN: [id number] Visit Date: 07/03/2020              Requested by: Jessica Alar, NP [address] [place],  [place] [address] PCP: Jessica Alar, NP   Assessment & Plan: Visit Diagnoses:  1. Left carpal tunnel syndrome   2. Right carpal tunnel syndrome   3. Neck pain     Plan: In terms of her hands she continues to have severe carpal tunnel symptoms and she wishes to proceed with a left carpal tunnel release in the near future.  Risk benefits rehab recovery time out of work all reviewed today.  In terms of the neck she has had continued cervical radiculopathy concerning for double crush syndrome so we will order C-spine MRI to rule out structural abnormalities.  Patient was able to speak with Jessica Kemp today to schedule surgery for a left carpal tunnel release.  Follow-Up Instructions: Return for postop.   Orders:  Orders Placed This Encounter  Procedures  . MR Cervical Spine w/o contrast   No orders of the defined types were placed in this encounter.     Procedures: No procedures performed   Clinical Data: No additional findings.   Subjective: Chief Complaint  Patient presents with  . Right Hand - Pain  . Left Hand - Pain    Jessica Kemp is a very pleasant 55 year old female referral from PCP Jessica Kemp for bilateral carpal tunnel syndrome and chronic neck pain and radiculopathy from neck.  She had nerve conduction studies and actually was scheduled to have surgery with Dr. French Kemp a few months ago but then changed her mind and decided not to have surgery.  Unfortunate her symptoms have progressed and become more severe and now she is open to have surgery for the carpal tunnel syndrome.  She is also continue to have radicular symptoms from her C-spine despite conservative treatments in terms of home exercises and oral medications.   Review of Systems   Constitutional: Negative.   HENT: Negative.   Eyes: Negative.   Respiratory: Negative.   Cardiovascular: Negative.   Endocrine: Negative.   Musculoskeletal: Negative.   Neurological: Negative.   Hematological: Negative.   Psychiatric/Behavioral: Negative.   All other systems reviewed and are negative.    Objective: Vital Signs: There were no vitals taken for this visit.  Physical Exam Vitals and nursing note reviewed.  Constitutional:      Appearance: She is well-developed.  HENT:     Head: Normocephalic and atraumatic.  Pulmonary:     Effort: Pulmonary effort is normal.  Abdominal:     Palpations: Abdomen is soft.  Musculoskeletal:     Cervical back: Neck supple.  Skin:    General: Skin is warm.     Capillary Refill: Capillary refill takes less than 2 seconds.  Neurological:     Mental Status: She is alert and oriented to person, place, and time.  Psychiatric:        Behavior: Behavior normal.        Thought Content: Thought content normal.        Judgment: Judgment normal.     Ortho Exam Bilateral hands show positive carpal tunnel compressive signs.  No muscle atrophy.  She is able make a full composite fist.  Positive grind test on the  left hand with crepitus and pain. Positive Spurling's and C-spine tenderness. Specialty Comments:  No specialty comments available.  Imaging: No results found.   PMFS History: Patient Active Problem List   Diagnosis Date Noted  . Hyperlipidemia 04/03/2020   Past Medical History:  Diagnosis Date  . Arthritis Gerd  . Blood in the stool   . Depression   . GERD (gastroesophageal reflux disease) depression  . Hypothyroidism    25 years ago  . Incontinence in female 2012    Family History  Problem Relation Age of Onset  . Leukemia Mother   . Diabetes Mother   . Hyperlipidemia Mother   . Stroke Mother   . Arthritis Father   . Diabetes Father   . Hypertension Father   . Stroke Father   . Alcohol abuse Brother    . Arthritis Brother   . Hypertension Brother   . Heart disease Maternal Grandmother   . Heart attack Maternal Grandmother   . Heart attack Maternal Grandfather   . Cancer Paternal Grandmother   . Leukemia Paternal Grandfather   . Lymphoma Son   . Colon cancer Neg Hx   . Esophageal cancer Neg Hx   . Stomach cancer Neg Hx   . Rectal cancer Neg Hx     Past Surgical History:  Procedure Laterality Date  . CESAREAN SECTION  1995  . PARTIAL HYSTERECTOMY  2012   Social History   Occupational History  . Occupation: Medical illustrator  Tobacco Use  . Smoking status: Former Smoker    Quit date: 03/03/2015    Years since quitting: 5.3  . Smokeless tobacco: Never Used  Vaping Use  . Vaping Use: Former  . Start date: 03/04/2015  . Quit date: 04/04/2015  Substance and Sexual Activity  . Alcohol use: No  . Drug use: Yes    Types: Marijuana  . Sexual activity: Not Currently

## 2020-07-04 ENCOUNTER — Other Ambulatory Visit: Payer: Self-pay

## 2020-07-04 ENCOUNTER — Encounter (HOSPITAL_BASED_OUTPATIENT_CLINIC_OR_DEPARTMENT_OTHER): Payer: Self-pay | Admitting: Orthopaedic Surgery

## 2020-07-06 ENCOUNTER — Telehealth: Payer: Self-pay | Admitting: Orthopaedic Surgery

## 2020-07-06 NOTE — Telephone Encounter (Signed)
Received $50.00 cash and 2 medical records release forms     Forwarding to [place] today

## 2020-07-09 ENCOUNTER — Other Ambulatory Visit (HOSPITAL_COMMUNITY)
Admission: RE | Admit: 2020-07-09 | Discharge: 2020-07-09 | Disposition: A | Discharge status: Home / Self Care | Payer: BC Managed Care – PPO | Source: Ambulatory Visit | Attending: Orthopaedic Surgery | Admitting: Orthopaedic Surgery

## 2020-07-09 DIAGNOSIS — Z87891 Personal history of nicotine dependence: Secondary | ICD-10-CM | POA: Diagnosis not present

## 2020-07-09 DIAGNOSIS — Z20822 Contact with and (suspected) exposure to covid-19: Secondary | ICD-10-CM | POA: Insufficient documentation

## 2020-07-09 DIAGNOSIS — G5602 Carpal tunnel syndrome, left upper limb: Secondary | ICD-10-CM | POA: Diagnosis not present

## 2020-07-09 DIAGNOSIS — Z01812 Encounter for preprocedural laboratory examination: Secondary | ICD-10-CM | POA: Insufficient documentation

## 2020-07-09 DIAGNOSIS — Z79899 Other long term (current) drug therapy: Secondary | ICD-10-CM | POA: Diagnosis not present

## 2020-07-10 LAB — SARS CORONAVIRUS 2 (TAT 6-24 HRS): SARS Coronavirus 2: NEGATIVE

## 2020-07-11 ENCOUNTER — Encounter (HOSPITAL_BASED_OUTPATIENT_CLINIC_OR_DEPARTMENT_OTHER)
Admission: RE | Disposition: A | Discharge status: Home / Self Care | Payer: Self-pay | Source: Home / Self Care | Attending: Orthopaedic Surgery

## 2020-07-11 ENCOUNTER — Ambulatory Visit (HOSPITAL_BASED_OUTPATIENT_CLINIC_OR_DEPARTMENT_OTHER)
Admission: RE | Admit: 2020-07-11 | Discharge: 2020-07-11 | Disposition: A | Discharge status: Home / Self Care | Payer: BC Managed Care – PPO | Attending: Orthopaedic Surgery | Admitting: Orthopaedic Surgery

## 2020-07-11 ENCOUNTER — Encounter (HOSPITAL_BASED_OUTPATIENT_CLINIC_OR_DEPARTMENT_OTHER): Payer: Self-pay | Admitting: Orthopaedic Surgery

## 2020-07-11 ENCOUNTER — Ambulatory Visit (HOSPITAL_BASED_OUTPATIENT_CLINIC_OR_DEPARTMENT_OTHER): Payer: BC Managed Care – PPO | Admitting: Anesthesiology

## 2020-07-11 ENCOUNTER — Other Ambulatory Visit: Payer: Self-pay

## 2020-07-11 DIAGNOSIS — F418 Other specified anxiety disorders: Secondary | ICD-10-CM | POA: Diagnosis not present

## 2020-07-11 DIAGNOSIS — Z79899 Other long term (current) drug therapy: Secondary | ICD-10-CM | POA: Insufficient documentation

## 2020-07-11 DIAGNOSIS — E785 Hyperlipidemia, unspecified: Secondary | ICD-10-CM | POA: Diagnosis not present

## 2020-07-11 DIAGNOSIS — Z20822 Contact with and (suspected) exposure to covid-19: Secondary | ICD-10-CM | POA: Insufficient documentation

## 2020-07-11 DIAGNOSIS — G5602 Carpal tunnel syndrome, left upper limb: Secondary | ICD-10-CM | POA: Diagnosis not present

## 2020-07-11 DIAGNOSIS — Z87891 Personal history of nicotine dependence: Secondary | ICD-10-CM | POA: Insufficient documentation

## 2020-07-11 DIAGNOSIS — E039 Hypothyroidism, unspecified: Secondary | ICD-10-CM | POA: Diagnosis not present

## 2020-07-11 HISTORY — DX: Anxiety disorder, unspecified: F41.9

## 2020-07-11 HISTORY — PX: CARPAL TUNNEL RELEASE: SHX101

## 2020-07-11 SURGERY — CARPAL TUNNEL RELEASE
Anesthesia: Monitor Anesthesia Care | Site: Wrist | Laterality: Left

## 2020-07-11 MED ORDER — CEFAZOLIN SODIUM-DEXTROSE 2-4 GM/100ML-% IV SOLN
2.0000 g | INTRAVENOUS | Status: AC
  Administered 2020-07-11: 2 g via INTRAVENOUS

## 2020-07-11 MED ORDER — ONDANSETRON HCL 4 MG/2ML IJ SOLN
INTRAMUSCULAR | Status: DC | PRN
  Administered 2020-07-11: 4 mg via INTRAVENOUS

## 2020-07-11 MED ORDER — LACTATED RINGERS IV SOLN: INTRAVENOUS | Status: DC

## 2020-07-11 MED ORDER — ACETAMINOPHEN 500 MG PO TABS
1000.0000 mg | ORAL_TABLET | Freq: Once | ORAL | Status: AC
  Administered 2020-07-11: 1000 mg via ORAL

## 2020-07-11 MED ORDER — PROPOFOL 10 MG/ML IV BOLUS
INTRAVENOUS | Status: DC | PRN
  Administered 2020-07-11: 30 mg via INTRAVENOUS

## 2020-07-11 MED ORDER — FENTANYL CITRATE (PF) 100 MCG/2ML IJ SOLN
INTRAMUSCULAR | Status: AC
  Filled 2020-07-11: qty 2

## 2020-07-11 MED ORDER — HYDROCODONE-ACETAMINOPHEN 5-325 MG PO TABS: 1.0000 | ORAL_TABLET | Freq: Four times a day (QID) | ORAL | 0 refills | Status: DC | PRN

## 2020-07-11 MED ORDER — FENTANYL CITRATE (PF) 100 MCG/2ML IJ SOLN: 25.0000 ug | INTRAMUSCULAR | Status: DC | PRN

## 2020-07-11 MED ORDER — PROPOFOL 500 MG/50ML IV EMUL
INTRAVENOUS | Status: AC
  Filled 2020-07-11: qty 50

## 2020-07-11 MED ORDER — 0.9 % SODIUM CHLORIDE (POUR BTL) OPTIME
TOPICAL | Status: DC | PRN
  Administered 2020-07-11: 200 mL

## 2020-07-11 MED ORDER — BUPIVACAINE HCL (PF) 0.25 % IJ SOLN
INTRAMUSCULAR | Status: AC
  Filled 2020-07-11: qty 30

## 2020-07-11 MED ORDER — MIDAZOLAM HCL 2 MG/2ML IJ SOLN
INTRAMUSCULAR | Status: DC | PRN
  Administered 2020-07-11: 2 mg via INTRAVENOUS

## 2020-07-11 MED ORDER — LIDOCAINE HCL 1 % IJ SOLN
INTRAMUSCULAR | Status: DC | PRN
  Administered 2020-07-11: 18 mL

## 2020-07-11 MED ORDER — ONDANSETRON HCL 4 MG/2ML IJ SOLN
INTRAMUSCULAR | Status: AC
  Filled 2020-07-11: qty 10

## 2020-07-11 MED ORDER — MIDAZOLAM HCL 2 MG/2ML IJ SOLN
INTRAMUSCULAR | Status: AC
  Filled 2020-07-11: qty 2

## 2020-07-11 MED ORDER — PROPOFOL 500 MG/50ML IV EMUL
INTRAVENOUS | Status: DC | PRN
  Administered 2020-07-11: 100 ug/kg/min via INTRAVENOUS

## 2020-07-11 MED ORDER — CEFAZOLIN SODIUM-DEXTROSE 2-4 GM/100ML-% IV SOLN
INTRAVENOUS | Status: AC
  Filled 2020-07-11: qty 100

## 2020-07-11 MED ORDER — ACETAMINOPHEN 500 MG PO TABS
ORAL_TABLET | ORAL | Status: AC
  Filled 2020-07-11: qty 2

## 2020-07-11 MED ORDER — FENTANYL CITRATE (PF) 100 MCG/2ML IJ SOLN
INTRAMUSCULAR | Status: DC | PRN
  Administered 2020-07-11 (×2): 50 ug via INTRAVENOUS

## 2020-07-11 SURGICAL SUPPLY — 46 items
BAND INSRT 18 STRL LF DISP RB (MISCELLANEOUS) ×2
BAND RUBBER #18 3X1/16 STRL (MISCELLANEOUS) ×4 IMPLANT
BLADE MINI RND TIP GREEN BEAV (BLADE) ×2 IMPLANT
BLADE SURG 15 STRL LF DISP TIS (BLADE) ×1 IMPLANT
BLADE SURG 15 STRL SS (BLADE) ×2
BNDG CMPR 9X4 STRL LF SNTH (GAUZE/BANDAGES/DRESSINGS) ×1
BNDG ELASTIC 3X5.8 VLCR STR LF (GAUZE/BANDAGES/DRESSINGS) ×2 IMPLANT
BNDG ESMARK 4X9 LF (GAUZE/BANDAGES/DRESSINGS) ×2 IMPLANT
BNDG PLASTER X FAST 3X3 WHT LF (CAST SUPPLIES) IMPLANT
BNDG PLSTR 9X3 FST ST WHT (CAST SUPPLIES)
BRUSH SCRUB EZ PLAIN DRY (MISCELLANEOUS) ×2 IMPLANT
CANISTER SUCT 1200ML W/VALVE (MISCELLANEOUS) ×2 IMPLANT
CORD BIPOLAR FORCEPS 12FT (ELECTRODE) ×2 IMPLANT
COVER BACK TABLE 60X90IN (DRAPES) ×2 IMPLANT
COVER MAYO STAND STRL (DRAPES) ×2 IMPLANT
COVER WAND RF STERILE (DRAPES) IMPLANT
CUFF TOURN SGL QUICK 18X4 (TOURNIQUET CUFF) IMPLANT
DECANTER SPIKE VIAL GLASS SM (MISCELLANEOUS) IMPLANT
DRAPE EXTREMITY T 121X128X90 (DISPOSABLE) ×2 IMPLANT
DRAPE IMP U-DRAPE 54X76 (DRAPES) ×2 IMPLANT
DRAPE SURG 17X23 STRL (DRAPES) ×2 IMPLANT
GAUZE 4X4 16PLY RFD (DISPOSABLE) IMPLANT
GAUZE SPONGE 4X4 12PLY STRL (GAUZE/BANDAGES/DRESSINGS) ×2 IMPLANT
GAUZE XEROFORM 1X8 LF (GAUZE/BANDAGES/DRESSINGS) ×2 IMPLANT
GLOVE SURG LTX SZ7 (GLOVE) ×2 IMPLANT
GLOVE SURG NEOP MICRO LF SZ7.5 (GLOVE) ×2 IMPLANT
GLOVE SURG SYN 7.5  E (GLOVE) ×2
GLOVE SURG SYN 7.5 E (GLOVE) ×1 IMPLANT
GLOVE SURG UNDER POLY LF SZ7 (GLOVE) ×2 IMPLANT
GOWN STRL REIN XL XLG (GOWN DISPOSABLE) ×2 IMPLANT
GOWN STRL REUS W/ TWL XL LVL3 (GOWN DISPOSABLE) ×2 IMPLANT
GOWN STRL REUS W/TWL XL LVL3 (GOWN DISPOSABLE) ×4
NEEDLE HYPO 25X1 1.5 SAFETY (NEEDLE) IMPLANT
NS IRRIG 1000ML POUR BTL (IV SOLUTION) ×2 IMPLANT
PACK BASIN DAY SURGERY FS (CUSTOM PROCEDURE TRAY) ×2 IMPLANT
PAD CAST 3X4 CTTN HI CHSV (CAST SUPPLIES) ×1 IMPLANT
PADDING CAST COTTON 3X4 STRL (CAST SUPPLIES) ×2
SHEET MEDIUM DRAPE 40X70 STRL (DRAPES) ×2 IMPLANT
STOCKINETTE 4X48 STRL (DRAPES) ×2 IMPLANT
SUT ETHILON 3 0 PS 1 (SUTURE) ×2 IMPLANT
SYR BULB EAR ULCER 3OZ GRN STR (SYRINGE) ×2 IMPLANT
SYR CONTROL 10ML LL (SYRINGE) IMPLANT
TOWEL GREEN STERILE FF (TOWEL DISPOSABLE) ×2 IMPLANT
TRAY DSU PREP LF (CUSTOM PROCEDURE TRAY) ×2 IMPLANT
TUBE CONNECTING 20X1/4 (TUBING) IMPLANT
UNDERPAD 30X36 HEAVY ABSORB (UNDERPADS AND DIAPERS) ×2 IMPLANT

## 2020-07-11 NOTE — Discharge Instructions (Signed)
Postoperative instructions:  Dressing instructions: Keep your dressing and/or splint clean and dry at all times.  It will be removed at your first post-operative appointment.  Your stitches and/or staples will be removed at this visit.  Incision instructions:  Do not soak your incision for 3 weeks after surgery.  If the incision gets wet, pat dry and do not scrub the incision.  Pain control:  You have been given a prescription to be taken as directed for post-operative pain control.  In addition, elevate the operative extremity above the heart at all times to prevent swelling and throbbing pain.  Take over-the-counter Colace, 100mg  by mouth twice a day while taking narcotic pain medications to help prevent constipation.  Follow up appointments: 1) 7 days for wound check. 2) Dr. Erlinda Hong as scheduled.   -------------------------------------------------------------------------------------------------------------  After Surgery Pain Control:  After your surgery, post-surgical discomfort or pain is likely. This discomfort can last several days to a few weeks. At certain times of the day your discomfort may be more intense.  Did you receive a nerve block?  A nerve block can provide pain relief for one hour to two days after your surgery. As long as the nerve block is working, you will experience little or no sensation in the area the surgeon operated on.  As the nerve block wears off, you will begin to experience pain or discomfort. It is very important that you begin taking your prescribed pain medication before the nerve block fully wears off. Treating your pain at the first sign of the block wearing off will ensure your pain is better controlled and more tolerable when full-sensation returns. Do not wait until the pain is intolerable, as the medicine will be less effective. It is better to treat pain in advance than to try and catch up.  General Anesthesia:  If you did not receive a nerve block  during your surgery, you will need to start taking your pain medication shortly after your surgery and should continue to do so as prescribed by your surgeon.  Pain Medication:  Most commonly we prescribe Vicodin and Percocet for post-operative pain. Both of these medications contain a combination of acetaminophen (Tylenol) and a narcotic to help control pain.   It takes between 30 and 45 minutes before pain medication starts to work. It is important to take your medication before your pain level gets too intense.   Nausea is a common side effect of many pain medications. You will want to eat something before taking your pain medicine to help prevent nausea.   If you are taking a prescription pain medication that contains acetaminophen, we recommend that you do not take additional over the counter acetaminophen (Tylenol).  Other pain relieving options:   Using a cold pack to ice the affected area a few times a day (15 to 20 minutes at a time) can help to relieve pain, reduce swelling and bruising.   Elevation of the affected area can also help to reduce pain and swelling.  No tylenol until 7pm   Post Anesthesia Home Care Instructions  Activity: Get plenty of rest for the remainder of the day. A responsible individual must stay with you for 24 hours following the procedure.  For the next 24 hours, DO NOT: -Drive a car -Paediatric nurse -Drink alcoholic beverages -Take any medication unless instructed by your physician -Make any legal decisions or sign important papers.  Meals: Start with liquid foods such as gelatin or soup. Progress to regular  foods as tolerated. Avoid greasy, spicy, heavy foods. If nausea and/or vomiting occur, drink only clear liquids until the nausea and/or vomiting subsides. Call your physician if vomiting continues.  Special Instructions/Symptoms: Your throat may feel dry or sore from the anesthesia or the breathing tube placed in your throat during surgery.  If this causes discomfort, gargle with warm salt water. The discomfort should disappear within 24 hours.  If you had a scopolamine patch placed behind your ear for the management of post- operative nausea and/or vomiting:  1. The medication in the patch is effective for 72 hours, after which it should be removed.  Wrap patch in a tissue and discard in the trash. Wash hands thoroughly with soap and water. 2. You may remove the patch earlier than 72 hours if you experience unpleasant side effects which may include dry mouth, dizziness or visual disturbances. 3. Avoid touching the patch. Wash your hands with soap and water after contact with the patch.

## 2020-07-11 NOTE — Anesthesia Preprocedure Evaluation (Addendum)
Anesthesia Evaluation  Patient identified by MRN, date of birth, ID band Patient awake    Reviewed: Allergy & Precautions, NPO status , Patient's Chart, lab work & pertinent test results  Airway Mallampati: II  TM Distance: >3 FB Neck ROM: Full    Dental no notable dental hx. (+) Teeth Intact, Dental Advisory Given   Pulmonary neg pulmonary ROS, former smoker,    Pulmonary exam normal breath sounds clear to auscultation       Cardiovascular negative cardio ROS Normal cardiovascular exam Rhythm:Regular Rate:Normal     Neuro/Psych PSYCHIATRIC DISORDERS Anxiety Depression negative neurological ROS     GI/Hepatic Neg liver ROS, GERD  Medicated and Controlled,  Endo/Other  Hypothyroidism   Renal/GU negative Renal ROS  negative genitourinary   Musculoskeletal  (+) Arthritis ,   Abdominal   Peds  Hematology negative hematology ROS (+)   Anesthesia Other Findings   Reproductive/Obstetrics                           Anesthesia Physical Anesthesia Plan  ASA: II  Anesthesia Plan: MAC   Post-op Pain Management:    Induction: Intravenous  PONV Risk Score and Plan: 2 and Propofol infusion, Treatment may vary due to age or medical condition and Midazolam  Airway Management Planned: Natural Airway  Additional Equipment:   Intra-op Plan:   Post-operative Plan:   Informed Consent: I have reviewed the patients History and Physical, chart, labs and discussed the procedure including the risks, benefits and alternatives for the proposed anesthesia with the patient or authorized representative who has indicated his/her understanding and acceptance.     Dental advisory given  Plan Discussed with: CRNA  Anesthesia Plan Comments:         Anesthesia Quick Evaluation

## 2020-07-11 NOTE — Transfer of Care (Signed)
Immediate Anesthesia Transfer of Care Note  Patient: Jessica Kemp  Procedure(s) Performed: LEFT CARPAL TUNNEL RELEASE (Left Wrist)  Patient Location: PACU  Anesthesia Type:MAC  Level of Consciousness: awake, alert  and oriented  Airway & Oxygen Therapy: Patient Spontanous Breathing and Patient connected to face mask oxygen  Post-op Assessment: Report given to RN and Post -op Vital signs reviewed and stable  Post vital signs: Reviewed and stable  Last Vitals:  Vitals Value Taken Time  BP 98/67 07/11/20 1640  Temp    Pulse 61 07/11/20 1641  Resp    SpO2 97 % 07/11/20 1641  Vitals shown include unvalidated device data.  Last Pain:  Vitals:   07/11/20 1324  TempSrc: Oral  PainSc: 8       Patients Stated Pain Goal: 7 (24/46/28 6381)  Complications: No complications documented.

## 2020-07-11 NOTE — Op Note (Signed)
   Carpal tunnel op note  DATE OF SURGERY:07/11/2020  PREOPERATIVE DIAGNOSIS:  Left carpal tunnel syndrome  POSTOPERATIVE DIAGNOSIS: same  PROCEDURE: Left carpal tunnel release. CPT 32671  SURGEON: Marianna Payment, M.D.  ASSIST: Madalyn Rob, [place]  ANESTHESIA:  Local and MAC  TOURNIQUET TIME: less than 20 minutes  BLOOD LOSS: Minimal.  COMPLICATIONS: None.  PATHOLOGY: None.  INDICATIONS: The patient is a 55 y.o. -year-old female who presented with carpal tunnel syndrome failing nonsurgical management, indicated for surgical release.  DESCRIPTION OF PROCEDURE: The patient was identified in the preoperative holding area.  The operative site was marked by the surgeon and confirmed by the patient.  The patient was brought back to the operating room.  MAC anesthesia was administered.  Local anesthetic with epi was injected into the operative site.  A well padded nonsterile tourniquet was placed. The operative extremity was prepped and draped in standard sterile fashion.  A timeout was performed.  Preoperative antibiotics were given.   A palmar incision was made about 5 mm ulnar to the thenar crease.  The palmar aponeurosis was exposed and divided in line with the skin incision. The palmaris brevis was visualized and divided.  The distal edge of the transcarpal ligament was identified. A hemostat was inserted into the carpal tunnel to protect the median nerve and the flexor tendons. Then, the transverse carpal ligament was released under direct visualization. Proximally, a subcutaneous tunnel was made allowing a Sewell retractor to be placed. Then, the distal portion of the antebrachial fascia was released. Distally, all fibrous bands were released. The median nerve was visualized, and the fat pad was exposed. Wound was irrigated and closed with 4-0 nylon sutures. Sterile dressing applied. The patient was transferred to the recovery room in stable condition after all counts were  correct.  POSTOPERATIVE PLAN: To start nerve gliding exercises as tolerated and no heavy lifting for four weeks.  Eduard Roux, M.D. [facility] 4:30 PM

## 2020-07-11 NOTE — H&P (Signed)
PREOPERATIVE H&P  Chief Complaint: left carpal tunnel syndrome  HPI: Jessica Kemp is a 55 y.o. female who presents for surgical treatment of left carpal tunnel syndrome.  She denies any changes in medical history.  Past Medical History:  Diagnosis Date  . Anxiety   . Arthritis    neck, hands  . Blood in the stool   . Depression   . GERD (gastroesophageal reflux disease)   . Hypothyroidism    25 years ago  . Incontinence in female 2012   Past Surgical History:  Procedure Laterality Date  . CESAREAN SECTION  1995  . PARTIAL HYSTERECTOMY  2012   Social History   Socioeconomic History  . Marital status: Divorced    Spouse name: Not on file  . Number of children: 3  . Years of education: Not on file  . Highest education level: Not on file  Occupational History  . Occupation: Medical illustrator  Tobacco Use  . Smoking status: Former Smoker    Quit date: 03/03/2015    Years since quitting: 5.3  . Smokeless tobacco: Never Used  Vaping Use  . Vaping Use: Former  . Start date: 03/04/2015  . Quit date: 04/04/2015  Substance and Sexual Activity  . Alcohol use: No  . Drug use: Yes    Types: Marijuana    Comment: last smoked 5-60mos ago  . Sexual activity: Not Currently    Birth control/protection: Surgical  Other Topics Concern  . Not on file  Social History Narrative   Lives with her dog   2 sons   1 daughter   6 grandchildren (one son local) Event organiser in [place]   Works in [employer]    Enjoys spending time with her grandchildren.    Social Determinants of Health   Financial Resource Strain: Not on file  Food Insecurity: Not on file  Transportation Needs: Not on file  Physical Activity: Not on file  Stress: Not on file  Social Connections: Not on file   Family History  Problem Relation Age of Onset  . Leukemia Mother   . Diabetes Mother   . Hyperlipidemia Mother   . Stroke Mother   . Arthritis Father   . Diabetes Father   . Hypertension Father    . Stroke Father   . Alcohol abuse Brother   . Arthritis Brother   . Hypertension Brother   . Heart disease Maternal Grandmother   . Heart attack Maternal Grandmother   . Heart attack Maternal Grandfather   . Cancer Paternal Grandmother   . Leukemia Paternal Grandfather   . Lymphoma Son   . Colon cancer Neg Hx   . Esophageal cancer Neg Hx   . Stomach cancer Neg Hx   . Rectal cancer Neg Hx    No Known Allergies Prior to Admission medications   Medication Sig Start Date End Date Taking? Authorizing Provider  FLUoxetine (PROZAC) 10 MG capsule TAKE 1 CAPSULE (10 MG TOTAL) BY MOUTH DAILY. 05/04/20 05/04/21 Yes Debbrah Alar, NP  gabapentin (NEURONTIN) 100 MG capsule Take 1 capsule (100 mg total) by mouth 3 (three) times daily. 06/08/20  Yes Debbrah Alar, NP  COVID-19 mRNA vaccine, Moderna, 100 MCG/0.5ML injection INJECT AS DIRECTED 04/10/20 04/10/21  Carlyle Basques, MD     Positive ROS: All other systems have been reviewed and were otherwise negative with the exception of those mentioned in the HPI and as above.  Physical Exam: General: Alert, no acute distress Cardiovascular: No pedal edema Respiratory:  No cyanosis, no use of accessory musculature GI: abdomen soft Skin: No lesions in the area of chief complaint Neurologic: Sensation intact distally Psychiatric: Patient is competent for consent with normal mood and affect Lymphatic: no lymphedema  MUSCULOSKELETAL: exam stable  Assessment: left carpal tunnel syndrome  Plan: Plan for Procedure(s): LEFT CARPAL TUNNEL RELEASE  The risks benefits and alternatives were discussed with the patient including but not limited to the risks of nonoperative treatment, versus surgical intervention including infection, bleeding, nerve injury,  blood clots, cardiopulmonary complications, morbidity, mortality, among others, and they were willing to proceed.   Preoperative templating of the joint replacement has been completed,  documented, and submitted to the Operating Room personnel in order to optimize intra-operative equipment management.   Eduard Roux, MD 07/11/2020 1:49 PM

## 2020-07-12 ENCOUNTER — Encounter (HOSPITAL_BASED_OUTPATIENT_CLINIC_OR_DEPARTMENT_OTHER): Payer: Self-pay | Admitting: Orthopaedic Surgery

## 2020-07-12 NOTE — Anesthesia Postprocedure Evaluation (Signed)
Anesthesia Post Note  Patient: Jessica Kemp  Procedure(s) Performed: LEFT CARPAL TUNNEL RELEASE (Left Wrist)     Patient location during evaluation: PACU Anesthesia Type: MAC Level of consciousness: awake and alert Pain management: pain level controlled Vital Signs Assessment: post-procedure vital signs reviewed and stable Respiratory status: spontaneous breathing, nonlabored ventilation, respiratory function stable and patient connected to nasal cannula oxygen Cardiovascular status: stable and blood pressure returned to baseline Postop Assessment: no apparent nausea or vomiting Anesthetic complications: no   No complications documented.  Last Vitals:  Vitals:   07/11/20 1658 07/11/20 1704  BP: 107/64 104/62  Pulse: 64 (!) 58  Resp: (!) 22 14  Temp:  36.6 C  SpO2: 93% 95%    Last Pain:  Vitals:   07/11/20 1704  TempSrc:   PainSc: 0-No pain                 Jessica Kemp L Moriah Shawley

## 2020-07-17 ENCOUNTER — Ambulatory Visit
Admission: RE | Admit: 2020-07-17 | Discharge: 2020-07-17 | Disposition: A | Discharge status: Home / Self Care | Payer: BC Managed Care – PPO | Source: Ambulatory Visit | Attending: Orthopaedic Surgery | Admitting: Orthopaedic Surgery

## 2020-07-17 ENCOUNTER — Other Ambulatory Visit: Payer: Self-pay

## 2020-07-17 DIAGNOSIS — M4802 Spinal stenosis, cervical region: Secondary | ICD-10-CM | POA: Diagnosis not present

## 2020-07-17 DIAGNOSIS — M542 Cervicalgia: Secondary | ICD-10-CM

## 2020-07-18 ENCOUNTER — Encounter: Payer: Self-pay | Admitting: Orthopaedic Surgery

## 2020-07-18 ENCOUNTER — Ambulatory Visit (INDEPENDENT_AMBULATORY_CARE_PROVIDER_SITE_OTHER): Payer: BC Managed Care – PPO | Admitting: Physician Assistant

## 2020-07-18 DIAGNOSIS — G5602 Carpal tunnel syndrome, left upper limb: Secondary | ICD-10-CM

## 2020-07-18 DIAGNOSIS — Z9889 Other specified postprocedural states: Secondary | ICD-10-CM

## 2020-07-18 MED ORDER — TRAMADOL HCL 50 MG PO TABS: 50.0000 mg | ORAL_TABLET | Freq: Three times a day (TID) | ORAL | 2 refills | Status: DC | PRN

## 2020-07-18 MED ORDER — ONDANSETRON HCL 4 MG PO TABS: 4.0000 mg | ORAL_TABLET | Freq: Three times a day (TID) | ORAL | 0 refills | Status: DC | PRN

## 2020-07-18 NOTE — Progress Notes (Signed)
Post-Op Visit Note   Patient: Jessica Kemp           Date of Birth: 1965/11/08           MRN: [id number] Visit Date: 07/18/2020 PCP: Debbrah Alar, NP   Assessment & Plan:  Chief Complaint:  Chief Complaint  Patient presents with  . Left Wrist - Pain   Visit Diagnoses:  1. S/P carpal tunnel release   2. Carpal tunnel syndrome on left     Plan: Patient is a very pleasant 55 year old female who comes in today 1 week out left carpal tunnel release for moderate compression median nerve.  She has been doing well.  She has been quite nauseous from her narcotic pain medications as been taking Tylenol without significant relief.  Examination of her left wrist reveals a well-healing surgical incision with nylon sutures in place.  No evidence of infection or cellulitis.  Fingers are warm well perfused.  Today, her wound was cleaned and recovered.  Removable splint provided.  She will follow-up with [place] next week for suture removal.  No heavy lifting or submerging her hand underwater for another 3 weeks.  Call with concerns or questions.  Follow-Up Instructions: Return in about 1 week (around 07/25/2020).   Orders:  No orders of the defined types were placed in this encounter.  Meds ordered this encounter  Medications  . traMADol (ULTRAM) 50 MG tablet    Sig: Take 1 tablet (50 mg total) by mouth 3 (three) times daily as needed.    Dispense:  30 tablet    Refill:  2  . ondansetron (ZOFRAN) 4 MG tablet    Sig: Take 1 tablet (4 mg total) by mouth every 8 (eight) hours as needed for nausea or vomiting.    Dispense:  40 tablet    Refill:  0    Imaging: No results found.  PMFS History: Patient Active Problem List   Diagnosis Date Noted  . Carpal tunnel syndrome on left 07/11/2020  . Hyperlipidemia 04/03/2020   Past Medical History:  Diagnosis Date  . Anxiety   . Arthritis    neck, hands  . Blood in the stool   . Depression   . GERD (gastroesophageal reflux disease)    . Hypothyroidism    25 years ago  . Incontinence in female 2012    Family History  Problem Relation Age of Onset  . Leukemia Mother   . Diabetes Mother   . Hyperlipidemia Mother   . Stroke Mother   . Arthritis Father   . Diabetes Father   . Hypertension Father   . Stroke Father   . Alcohol abuse Brother   . Arthritis Brother   . Hypertension Brother   . Heart disease Maternal Grandmother   . Heart attack Maternal Grandmother   . Heart attack Maternal Grandfather   . Cancer Paternal Grandmother   . Leukemia Paternal Grandfather   . Lymphoma Son   . Colon cancer Neg Hx   . Esophageal cancer Neg Hx   . Stomach cancer Neg Hx   . Rectal cancer Neg Hx     Past Surgical History:  Procedure Laterality Date  . CARPAL TUNNEL RELEASE Left 07/11/2020   Procedure: LEFT CARPAL TUNNEL RELEASE;  Surgeon: Leandrew Koyanagi, MD;  Location: [facility];  Service: Orthopedics;  Laterality: Left;  . CESAREAN SECTION  1995  . PARTIAL HYSTERECTOMY  2012   Social History   Occupational History  . Occupation:  Insurance agent  Tobacco Use  . Smoking status: Former Smoker    Quit date: 03/03/2015    Years since quitting: 5.3  . Smokeless tobacco: Never Used  Vaping Use  . Vaping Use: Former  . Start date: 03/04/2015  . Quit date: 04/04/2015  Substance and Sexual Activity  . Alcohol use: No  . Drug use: Yes    Types: Marijuana    Comment: last smoked 5-80mos ago  . Sexual activity: Not Currently    Birth control/protection: Surgical

## 2020-07-19 ENCOUNTER — Telehealth: Payer: Self-pay | Admitting: Orthopaedic Surgery

## 2020-07-24 ENCOUNTER — Other Ambulatory Visit: Payer: Self-pay

## 2020-07-24 ENCOUNTER — Encounter: Payer: Self-pay | Admitting: Orthopaedic Surgery

## 2020-07-24 ENCOUNTER — Ambulatory Visit (INDEPENDENT_AMBULATORY_CARE_PROVIDER_SITE_OTHER): Payer: BC Managed Care – PPO | Admitting: Orthopaedic Surgery

## 2020-07-24 VITALS — Ht 61.0 in | Wt 188.0 lb

## 2020-07-24 DIAGNOSIS — Z9889 Other specified postprocedural states: Secondary | ICD-10-CM

## 2020-07-24 DIAGNOSIS — M5412 Radiculopathy, cervical region: Secondary | ICD-10-CM

## 2020-07-24 DIAGNOSIS — G5602 Carpal tunnel syndrome, left upper limb: Secondary | ICD-10-CM

## 2020-07-24 NOTE — Progress Notes (Signed)
Office Visit Note   Patient: Jessica Kemp           Date of Birth: 16-Feb-1966           MRN: [id number] Visit Date: 07/24/2020              Requested by: Debbrah Alar, NP [address] [place],  [place] [id number] PCP: Debbrah Alar, NP   Assessment & Plan: Visit Diagnoses:  1. Radiculopathy of cervical spine   2. Carpal tunnel syndrome on left   3. History of carpal tunnel release     Plan: Impression is 2-week status post left carpal tunnel syndrome and right upper extremity cervical spine radiculopathy with findings suggestive of myelomalacia.  In regards to the left wrist, sutures were removed and Steri-Strips applied.  She will wear her wrist splint for comfort.  Continue with range of motion exercises.  No heavy lifting or submerging her hand in water for another 2 weeks.  Follow-up with [place] in 4 weeks time for recheck.  In regards to her cervical spine, we have made an urgent referral to neuro surgery for this.  Follow-Up Instructions: Return in about 4 weeks (around 08/21/2020).   Orders:  No orders of the defined types were placed in this encounter.  No orders of the defined types were placed in this encounter.     Procedures: No procedures performed   Clinical Data: No additional findings.   Subjective: Chief Complaint  Patient presents with  . Neck - Follow-up    MRI review  . Left Wrist - Follow-up    Left carpal tunnel release 07/11/2020    HPI patient is a pleasant 55 year old female who comes in today 2 weeks out left carpal tunnel syndrome for moderate compression median nerve.  She has been doing well.  She still has numbness throughout the median nerve distribution.  She is also here to discuss MRI results of the cervical spine.  She has been dealing with right-sided moderate median nerve compression but has not yet undergone carpal tunnel release as she has also been experiencing symptoms associated with cervical spine  radiculopathy to include weakness of the right upper extremity and paresthesias to the entire right hand.  MRI of the cervical spine shows C6-7 degenerative spinal stenosis with abnormal spinal cord signal suggestive of myelomalacia, spinal stenosis C4-5 and C5-6, severe degenerative neuroforaminal stenosis bilateral C6-7 as well as moderate bilateral C5 foraminal stenosis.  Review of Systems as detailed in HPI.  All others reviewed and are negative.   Objective: Vital Signs: Ht 5\' 1"  (1.549 m)   Wt 188 lb (85.3 kg)   BMI 35.52 kg/m   Physical Exam well-developed and well-nourished female in no acute distress.  Alert and oriented x3  Ortho Exam examination of the left wrist reveals a well-healed surgical incision with nylon sutures in place.  No evidence of infection or cellulitis.  Fingers are warm and well-perfused.  Right upper extremity exam shows that decreased reflexes to the triceps, biceps and brachial radialis.  She has full range of motion of the shoulder, elbow and wrist. Specialty Comments:  No specialty comments available.  Imaging: No new imaging   PMFS History: Patient Active Problem List   Diagnosis Date Noted  . Carpal tunnel syndrome on left 07/11/2020  . Hyperlipidemia 04/03/2020   Past Medical History:  Diagnosis Date  . Anxiety   . Arthritis    neck, hands  . Blood in the stool   .  Depression   . GERD (gastroesophageal reflux disease)   . Hypothyroidism    25 years ago  . Incontinence in female 2012    Family History  Problem Relation Age of Onset  . Leukemia Mother   . Diabetes Mother   . Hyperlipidemia Mother   . Stroke Mother   . Arthritis Father   . Diabetes Father   . Hypertension Father   . Stroke Father   . Alcohol abuse Brother   . Arthritis Brother   . Hypertension Brother   . Heart disease Maternal Grandmother   . Heart attack Maternal Grandmother   . Heart attack Maternal Grandfather   . Cancer Paternal Grandmother   . Leukemia  Paternal Grandfather   . Lymphoma Son   . Colon cancer Neg Hx   . Esophageal cancer Neg Hx   . Stomach cancer Neg Hx   . Rectal cancer Neg Hx     Past Surgical History:  Procedure Laterality Date  . CARPAL TUNNEL RELEASE Left 07/11/2020   Procedure: LEFT CARPAL TUNNEL RELEASE;  Surgeon: Leandrew Koyanagi, MD;  Location: [facility];  Service: Orthopedics;  Laterality: Left;  . CESAREAN SECTION  1995  . PARTIAL HYSTERECTOMY  2012   Social History   Occupational History  . Occupation: Medical illustrator  Tobacco Use  . Smoking status: Former Smoker    Quit date: 03/03/2015    Years since quitting: 5.3  . Smokeless tobacco: Never Used  Vaping Use  . Vaping Use: Former  . Start date: 03/04/2015  . Quit date: 04/04/2015  Substance and Sexual Activity  . Alcohol use: No  . Drug use: Yes    Types: Marijuana    Comment: last smoked 5-62mos ago  . Sexual activity: Not Currently    Birth control/protection: Surgical

## 2020-08-06 DIAGNOSIS — Z6835 Body mass index (BMI) 35.0-35.9, adult: Secondary | ICD-10-CM | POA: Diagnosis not present

## 2020-08-06 DIAGNOSIS — G992 Myelopathy in diseases classified elsewhere: Secondary | ICD-10-CM | POA: Diagnosis not present

## 2020-08-06 DIAGNOSIS — M4802 Spinal stenosis, cervical region: Secondary | ICD-10-CM | POA: Diagnosis not present

## 2020-08-07 ENCOUNTER — Ambulatory Visit (INDEPENDENT_AMBULATORY_CARE_PROVIDER_SITE_OTHER): Payer: BC Managed Care – PPO | Admitting: Orthopaedic Surgery

## 2020-08-07 DIAGNOSIS — Z9889 Other specified postprocedural states: Secondary | ICD-10-CM

## 2020-08-07 NOTE — Progress Notes (Signed)
Post-Op Visit Note   Patient: Jessica Kemp           Date of Birth: [birth year]           MRN: [id number] Visit Date: 08/07/2020 PCP: Debbrah Alar, NP   Assessment & Plan:  Chief Complaint:  Chief Complaint  Patient presents with  . Other    Follow up left wrist s/p CTR   Visit Diagnoses:  1. S/P carpal tunnel release     Plan:   [name] is status post left carpal tunnel release on 07/11/2020.  Overall she has felt improvement but still some residual numbness in the long finger.  She is scheduled for ACDF with Dr. Kathyrn Sheriff on 08/23/2020 for myelomalacia.  She is doing better but still has difficulty with grasping and with typing which she recently tried.  Left hand shows fully healed surgical scar.  There is some slight tenderness in this region.  She lacks about a centimeter of opposition from the thumb tip to the fifth metacarpal head.  Grip strength is significantly weak.  At this point we will continue to keep her out of work until her ACDF surgery so that she can continue to recover from the carpal tunnel surgery.  She has tried typing and grasping but she is having trouble with this.  I do not think that she is ready to return back to full duty.  She will follow-up with [place] for a right carpal tunnel release when she has been released from Dr. Kathyrn Sheriff.  Follow-Up Instructions: No follow-ups on file.   Orders:  No orders of the defined types were placed in this encounter.  No orders of the defined types were placed in this encounter.   Imaging: No results found.  PMFS History: Patient Active Problem List   Diagnosis Date Noted  . Carpal tunnel syndrome on left 07/11/2020  . Hyperlipidemia 04/03/2020   Past Medical History:  Diagnosis Date  . Anxiety   . Arthritis    neck, hands  . Blood in the stool   . Depression   . GERD (gastroesophageal reflux disease)   . Hypothyroidism    25 years ago  . Incontinence in female 2012    Family History   Problem Relation Age of Onset  . Leukemia Mother   . Diabetes Mother   . Hyperlipidemia Mother   . Stroke Mother   . Arthritis Father   . Diabetes Father   . Hypertension Father   . Stroke Father   . Alcohol abuse Brother   . Arthritis Brother   . Hypertension Brother   . Heart disease Maternal Grandmother   . Heart attack Maternal Grandmother   . Heart attack Maternal Grandfather   . Cancer Paternal Grandmother   . Leukemia Paternal Grandfather   . Lymphoma Son   . Colon cancer Neg Hx   . Esophageal cancer Neg Hx   . Stomach cancer Neg Hx   . Rectal cancer Neg Hx     Past Surgical History:  Procedure Laterality Date  . CARPAL TUNNEL RELEASE Left 07/11/2020   Procedure: LEFT CARPAL TUNNEL RELEASE;  Surgeon: Leandrew Koyanagi, MD;  Location: [facility];  Service: Orthopedics;  Laterality: Left;  . CESAREAN SECTION  1995  . PARTIAL HYSTERECTOMY  2012   Social History   Occupational History  . Occupation: Medical illustrator  Tobacco Use  . Smoking status: Former Smoker    Quit date: 03/03/2015    Years since quitting:  5.4  . Smokeless tobacco: Never Used  Vaping Use  . Vaping Use: Former  . Start date: 03/04/2015  . Quit date: 04/04/2015  Substance and Sexual Activity  . Alcohol use: No  . Drug use: Yes    Types: Marijuana    Comment: last smoked 5-55mos ago  . Sexual activity: Not Currently    Birth control/protection: Surgical

## 2020-08-08 ENCOUNTER — Encounter: Payer: Self-pay | Admitting: Family

## 2020-08-09 ENCOUNTER — Other Ambulatory Visit (HOSPITAL_BASED_OUTPATIENT_CLINIC_OR_DEPARTMENT_OTHER): Payer: Self-pay

## 2020-08-09 ENCOUNTER — Other Ambulatory Visit: Payer: Self-pay

## 2020-08-09 MED ORDER — FLUOXETINE HCL 10 MG PO CAPS
ORAL_CAPSULE | Freq: Every day | ORAL | 0 refills | Status: DC
  Filled 2020-08-09: qty 30, 30d supply, fill #0

## 2020-08-09 MED ORDER — FLUOXETINE HCL 10 MG PO CAPS: ORAL_CAPSULE | Freq: Every day | ORAL | 0 refills | Status: DC

## 2020-08-09 NOTE — Progress Notes (Signed)
y

## 2020-08-23 DIAGNOSIS — M4802 Spinal stenosis, cervical region: Secondary | ICD-10-CM | POA: Diagnosis not present

## 2020-08-23 DIAGNOSIS — M4712 Other spondylosis with myelopathy, cervical region: Secondary | ICD-10-CM | POA: Diagnosis not present

## 2020-08-31 HISTORY — PX: ANTERIOR CERVICAL DECOMP/DISCECTOMY FUSION: SHX1161

## 2020-09-06 ENCOUNTER — Other Ambulatory Visit: Payer: Self-pay | Admitting: Family

## 2020-09-17 DIAGNOSIS — M4802 Spinal stenosis, cervical region: Secondary | ICD-10-CM | POA: Diagnosis not present

## 2020-09-25 ENCOUNTER — Encounter (HOSPITAL_BASED_OUTPATIENT_CLINIC_OR_DEPARTMENT_OTHER): Payer: Self-pay | Admitting: Orthopaedic Surgery

## 2020-10-31 ENCOUNTER — Ambulatory Visit (INDEPENDENT_AMBULATORY_CARE_PROVIDER_SITE_OTHER): Payer: BC Managed Care – PPO | Admitting: Family

## 2020-10-31 ENCOUNTER — Other Ambulatory Visit: Payer: Self-pay

## 2020-10-31 ENCOUNTER — Other Ambulatory Visit (HOSPITAL_BASED_OUTPATIENT_CLINIC_OR_DEPARTMENT_OTHER): Payer: Self-pay

## 2020-10-31 ENCOUNTER — Encounter: Payer: Self-pay | Admitting: Family

## 2020-10-31 VITALS — BP 125/70 | HR 84 | Temp 98.5°F | Resp 16 | Wt 186.0 lb

## 2020-10-31 DIAGNOSIS — Z23 Encounter for immunization: Secondary | ICD-10-CM

## 2020-10-31 DIAGNOSIS — M5412 Radiculopathy, cervical region: Secondary | ICD-10-CM | POA: Insufficient documentation

## 2020-10-31 DIAGNOSIS — F418 Other specified anxiety disorders: Secondary | ICD-10-CM | POA: Insufficient documentation

## 2020-10-31 DIAGNOSIS — H8110 Benign paroxysmal vertigo, unspecified ear: Secondary | ICD-10-CM | POA: Insufficient documentation

## 2020-10-31 DIAGNOSIS — G5602 Carpal tunnel syndrome, left upper limb: Secondary | ICD-10-CM

## 2020-10-31 DIAGNOSIS — M180 Bilateral primary osteoarthritis of first carpometacarpal joints: Secondary | ICD-10-CM | POA: Insufficient documentation

## 2020-10-31 DIAGNOSIS — E785 Hyperlipidemia, unspecified: Secondary | ICD-10-CM | POA: Diagnosis not present

## 2020-10-31 LAB — COMPREHENSIVE METABOLIC PANEL
ALT: 33 U/L (ref 0–35)
AST: 21 U/L (ref 0–37)
Albumin: 4.3 g/dL (ref 3.5–5.2)
Alkaline Phosphatase: 86 U/L (ref 39–117)
BUN: 12 mg/dL (ref 6–23)
CO2: 25 mEq/L (ref 19–32)
Calcium: 9.5 mg/dL (ref 8.4–10.5)
Chloride: 104 mEq/L (ref 96–112)
Creatinine, Ser: 0.7 mg/dL (ref 0.40–1.20)
GFR: 97.04 mL/min (ref >60.00)
Glucose, Bld: 94 mg/dL (ref 70–99)
Potassium: 4.4 mEq/L (ref 3.5–5.1)
Sodium: 138 mEq/L (ref 135–145)
Total Bilirubin: 0.3 mg/dL (ref 0.2–1.2)
Total Protein: 7 g/dL (ref 6.0–8.3)

## 2020-10-31 LAB — LDL CHOLESTEROL, DIRECT: Direct LDL: 137 mg/dL

## 2020-10-31 LAB — CBC WITH DIFFERENTIAL/PLATELET
Basophils Absolute: 0 10*3/uL (ref 0.0–0.1)
Basophils Relative: 0.7 % (ref 0.0–3.0)
Eosinophils Absolute: 0.2 10*3/uL (ref 0.0–0.7)
Eosinophils Relative: 2.9 % (ref 0.0–5.0)
HCT: 41.1 % (ref 36.0–46.0)
Hemoglobin: 13.6 g/dL (ref 12.0–15.0)
Lymphocytes Relative: 35.4 % (ref 12.0–46.0)
Lymphs Abs: 2.3 10*3/uL (ref 0.7–4.0)
MCHC: 33 g/dL (ref 30.0–36.0)
MCV: 89.6 fl (ref 78.0–100.0)
Monocytes Absolute: 0.4 10*3/uL (ref 0.1–1.0)
Monocytes Relative: 5.4 % (ref 3.0–12.0)
Neutro Abs: 3.7 10*3/uL (ref 1.4–7.7)
Neutrophils Relative %: 55.6 % (ref 43.0–77.0)
Platelets: 289 10*3/uL (ref 150.0–400.0)
RBC: 4.59 Mil/uL (ref 3.87–5.11)
RDW: 14.7 % (ref 11.5–15.5)
WBC: 6.6 10*3/uL (ref 4.0–10.5)

## 2020-10-31 LAB — LIPID PANEL
Cholesterol: 225 mg/dL — ABNORMAL HIGH (ref 0–200)
HDL: 42.9 mg/dL (ref >39.00)
NonHDL: 181.74
Total CHOL/HDL Ratio: 5
Triglycerides: 244 mg/dL — ABNORMAL HIGH (ref 0.0–149.0)
VLDL: 48.8 mg/dL — ABNORMAL HIGH (ref 0.0–40.0)

## 2020-10-31 MED ORDER — FLUOXETINE HCL 20 MG PO CAPS
20.0000 mg | ORAL_CAPSULE | Freq: Every day | ORAL | 3 refills | Status: DC
  Filled 2020-10-31: qty 90, 90d supply, fill #0
  Filled 2021-03-27: qty 90, 90d supply, fill #1
  Filled 2021-05-23 – 2021-08-14 (×2): qty 90, 90d supply, fill #2

## 2020-10-31 MED ORDER — GABAPENTIN 100 MG PO CAPS
100.0000 mg | ORAL_CAPSULE | Freq: Three times a day (TID) | ORAL | 3 refills | Status: DC
  Filled 2020-10-31: qty 90, 30d supply, fill #0
  Filled 2020-12-15 – 2020-12-17 (×2): qty 90, 30d supply, fill #1
  Filled 2021-02-27: qty 90, 30d supply, fill #2
  Filled 2021-03-27: qty 90, 30d supply, fill #3

## 2020-10-31 MED ORDER — MECLIZINE HCL 25 MG PO TABS
25.0000 mg | ORAL_TABLET | Freq: Three times a day (TID) | ORAL | 0 refills | Status: DC | PRN
  Filled 2020-10-31: qty 30, 10d supply, fill #0

## 2020-10-31 NOTE — Assessment & Plan Note (Signed)
Uncontrolled. Will increase prozac from '10mg'$  to '20mg'$ . Plan follow up in 1 month.

## 2020-10-31 NOTE — Progress Notes (Signed)
Subjective:   By signing my name below, I, Shehryar Baig, attest that this documentation has been prepared under the direction and in the presence of Debbrah Alar NP. 10/31/2020    Patient ID: Jessica Kemp, female    DOB: Apr 27, 1965, 55 y.o.   MRN: [id number]  Chief Complaint  Patient presents with   Anxiety    Follow up, will like to discontinue prozac     HPI Patient is in today for a office visit. Right arm- She complains of weakness and numbness in her right arm. She also recently developed spasms in her right hand. She She reports having a carpal tunnel procedure on her left hand and found relief. She recently had a spinal fusion procedure and thinks it may be a side effects of that. She did not do any physical therapy following her surgical procedure. She also notes that she is struggling to complete her daily tasks and job. 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 She is willing to try physical therapy and occupational therapy to manage her symptoms.  Dizziness- She complains of occasional dizziness when turning her head since after her last procedure.  Mood- She notes that her 10 mg Prozac daily PO is not effective and is requesting to switch to a stronger medication.  Insomnia- She is struggling to manage her sleep. She has anxiety due to not going to work at this time which causes her to stay awake.  Cholesterol- Her cholesterol levels were elevated during her last lipid panel. She started taking 20 mg Lipitor daily PO and reports no new issues while taking it. She is requesting to get another lipid panel during her next lab work.   Lab Results  Component Value Date   CHOL 304 (H) 04/03/2020   HDL 36.10 (L) 04/03/2020   LDLDIRECT 169.0 04/03/2020   TRIG (H) 04/03/2020    459.0 Triglyceride is over 400; calculations on Lipids are invalid.   CHOLHDL 8 04/03/2020   Immunizations- She reports getting her flu  vaccine today. She is eligible for the shingles vaccine and is interested in getting it during this visit.   Health Maintenance Due  Topic Date Due   HIV Screening  Never done   Hepatitis C Screening  Never done   TETANUS/TDAP  Never done   MAMMOGRAM  Never done   COVID-19 Vaccine (4 - Booster for Moderna series) 08/08/2020    Past Medical History:  Diagnosis Date   Anxiety    Arthritis    neck, hands   Blood in the stool    Depression    GERD (gastroesophageal reflux disease)    Hypothyroidism    25 years ago   Incontinence in female 2012    Past Surgical History:  Procedure Laterality Date   ANTERIOR CERVICAL DECOMP/DISCECTOMY FUSION  08/2020   C4-5 C5-6 C6-7 ACD   CARPAL TUNNEL RELEASE Left 07/11/2020   Procedure: LEFT CARPAL TUNNEL RELEASE;  Surgeon: Leandrew Koyanagi, MD;  Location: [facility];  Service: Orthopedics;  Laterality: Left;   [place]   PARTIAL HYSTERECTOMY  2012    Family History  Problem Relation Age of Onset   Leukemia Mother    Diabetes Mother    Hyperlipidemia Mother    Stroke Mother    Arthritis Father    Diabetes Father    Hypertension Father    Stroke Father    Alcohol abuse Brother  Arthritis Brother    Hypertension Brother    Heart disease Maternal Grandmother    Heart attack Maternal Grandmother    Heart attack Maternal Grandfather    Cancer Paternal Grandmother    Leukemia Paternal Grandfather    Lymphoma Son    Colon cancer Neg Hx    Esophageal cancer Neg Hx    Stomach cancer Neg Hx    Rectal cancer Neg Hx     Social History   Socioeconomic History   Marital status: Divorced    Spouse name: Not on file   Number of children: 3   Years of education: Not on file   Highest education level: Not on file  Occupational History   Occupation: Medical illustrator  Tobacco Use   Smoking status: Former    Types: Cigarettes    Quit date: 03/03/2015    Years since quitting: 5.6   Smokeless tobacco: Never   Vaping Use   Vaping Use: Former   Start date: 03/04/2015   Quit date: 04/04/2015  Substance and Sexual Activity   Alcohol use: No   Drug use: Yes    Types: Marijuana    Comment: last smoked 5-17mo ago   Sexual activity: Not Currently    Birth control/protection: Surgical  Other Topics Concern   Not on file  Social History Narrative   Lives with her dog   2 sons   1 daughter   6 grandchildren (one son local) /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    Social Determinants of Health   Financial Resource Strain: Not on file  Food Insecurity: Not on file  Transportation Needs: Not on file  Physical Activity: Not on file  Stress: Not on file  Social Connections: Not on file  Intimate Partner Violence: Not on file    Outpatient Medications Prior to Visit  Medication Sig Dispense Refill   atorvastatin (LIPITOR) 20 MG tablet Take 20 mg by mouth daily.     FLUoxetine (PROZAC) 10 MG capsule TAKE 1 CAPSULE BY MOUTH EVERY DAY 90 capsule 1   COVID-19 mRNA vaccine, Moderna, 100 MCG/0.5ML injection INJECT AS DIRECTED .25 mL 0   gabapentin (NEURONTIN) 100 MG capsule Take 1 capsule (100 mg total) by mouth 3 (three) times daily. 90 capsule 3   HYDROcodone-acetaminophen (NORCO) 5-325 MG tablet Take 1 tablet by mouth every 6 (six) hours as needed. 20 tablet 0   ondansetron (ZOFRAN) 4 MG tablet Take 1 tablet (4 mg total) by mouth every 8 (eight) hours as needed for nausea or vomiting. 40 tablet 0   traMADol (ULTRAM) 50 MG tablet Take 1 tablet (50 mg total) by mouth 3 (three) times daily as needed. 30 tablet 2   No facility-administered medications prior to visit.    No Known Allergies  Review of Systems  Neurological:  Positive for dizziness, tingling (Right arm) and weakness (Right arm).  Psychiatric/Behavioral:  The patient has insomnia.         Objective:    Physical Exam Constitutional:      General: She is not in acute  distress.    Appearance: Normal appearance. She is not ill-appearing.  HENT:     Head: Normocephalic and atraumatic.     Right Ear: External ear normal.     Left Ear: External ear normal.  Eyes:     Extraocular Movements: Extraocular movements intact.     Pupils: Pupils are equal, round, and reactive to  light.  Cardiovascular:     Rate and Rhythm: Normal rate and regular rhythm.     Heart sounds: Normal heart sounds. No murmur heard.   No gallop.  Pulmonary:     Effort: Pulmonary effort is normal. No respiratory distress.     Breath sounds: Normal breath sounds. No wheezing or rales.  Musculoskeletal:     Comments: 4/5 strength in upper right extremity. 5/5 strength in upper left and both lower extremities  Skin:    General: Skin is warm and dry.  Neurological:     Mental Status: She is alert and oriented to person, place, and time.     Deep Tendon Reflexes:     Reflex Scores:      Bicep reflexes are 2+ on the right side and 2+ on the left side.      Brachioradialis reflexes are 2+ on the right side and 2+ on the left side.    Comments: Upper extremity weakness, R>L Positive Dix-Hallpike test    Psychiatric:        Behavior: Behavior normal.    BP 125/70 (BP Location: Right Arm, Patient Position: Sitting, Cuff Size: Small)   Pulse 84   Temp 98.5 F (36.9 C) (Oral)   Resp 16   Wt 186 lb (84.4 kg)   SpO2 98%   BMI 35.14 kg/m  Wt Readings from Last 3 Encounters:  10/31/20 186 lb (84.4 kg)  07/24/20 188 lb (85.3 kg)  07/11/20 188 lb 15 oz (85.7 kg)       Assessment & Plan:   Problem List Items Addressed This Visit       Unprioritized   Hyperlipidemia - Primary    She is back on statin.  Will recheck lipid panel.  She is requesting CBC, CMET for her neurosurgeon.       Relevant Medications   atorvastatin (LIPITOR) 20 MG tablet   Other Relevant Orders   Lipid panel   Comp Met (CMET)   Depression with anxiety    Uncontrolled. Will increase prozac from 35m  to 275m Plan follow up in 1 month.       Relevant Medications   FLUoxetine (PROZAC) 20 MG capsule   Cervical radiculopathy    She is still struggling with strength in the RUE. She needs to return to work in 2 weeks.  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 Will refer for outpatient PT/OT. I suspect that some of her RUE weakness is related to myelomalacia of the C-spine which was noted pre-operatively on MRI.       Relevant Medications   FLUoxetine (PROZAC) 20 MG capsule   gabapentin (NEURONTIN) 100 MG capsule   Other Relevant Orders   CBC with Differential/Platelet   Ambulatory referral to Physical Therapy   Ambulatory referral to Occupational Therapy   Carpal tunnel syndrome on left    S/p surgery- improved.       Relevant Medications   FLUoxetine (PROZAC) 20 MG capsule   gabapentin (NEURONTIN) 100 MG capsule   Benign paroxysmal positional vertigo    New. Rx trial of meclizine 2563mO TID.      Arthritis of carpometacarpal (CMC) joint of both thumbs    I recommended that she follow up with ortho to discuss possible joint injections.       Other Visit Diagnoses     Need for shingles vaccine       Relevant Orders   Varicella-zoster vaccine  IM (Shingrix) (Completed)   Needs flu shot       Relevant Orders   Flu Vaccine QUAD 6+ mos PF IM (Fluarix Quad PF) (Completed)        Meds ordered this encounter  Medications   FLUoxetine (PROZAC) 20 MG capsule    Sig: Take 1 capsule (20 mg total) by mouth daily.    Dispense:  90 capsule    Refill:  3    Order Specific Question:   Supervising Provider    Answer:   Penni Homans A [[id number]]   gabapentin (NEURONTIN) 100 MG capsule    Sig: Take 1 capsule (100 mg total) by mouth 3 (three) times daily.    Dispense:  90 capsule    Refill:  3    Order Specific Question:   Supervising Provider    Answer:   Penni Homans A [[id number]]   meclizine (ANTIVERT) 25 MG tablet    Sig: Take 1 tablet (25 mg  total) by mouth 3 (three) times daily as needed for dizziness.    Dispense:  30 tablet    Refill:  0    Order Specific Question:   Supervising Provider    Answer:   Penni Homans A [[id number]]    I, Debbrah Alar NP, personally preformed the services described in this documentation.  All medical record entries made by the scribe were at my direction and in my presence.  I have reviewed the chart and discharge instructions (if applicable) and agree that the record reflects my personal performance and is accurate and complete. 10/31/2020   I,Shehryar Baig,acting as a Education administrator for Nance Pear, NP.,have documented all relevant documentation on the behalf of Nance Pear, NP,as directed by  Nance Pear, NP while in the presence of Nance Pear, NP.   Nance Pear, NP

## 2020-10-31 NOTE — Assessment & Plan Note (Signed)
She is back on statin.  Will recheck lipid panel.  She is requesting CBC, CMET for her neurosurgeon.

## 2020-10-31 NOTE — Assessment & Plan Note (Addendum)
She is still struggling with strength in the RUE. She needs to return to work in 2 weeks.  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 Will refer for outpatient PT/OT. I suspect that some of her RUE weakness is related to myelomalacia of the C-spine which was noted pre-operatively on MRI.   Will restart gabapentin for RUE pain as it was helpful previously.

## 2020-10-31 NOTE — Assessment & Plan Note (Signed)
I recommended that she follow up with ortho to discuss possible joint injections.

## 2020-10-31 NOTE — Assessment & Plan Note (Signed)
S/p surgery- improved.

## 2020-10-31 NOTE — Patient Instructions (Signed)
Please complete lab work prior to leaving. Start meclizine 3 x daily as needed for dizziness. Increase prozac to '20mg'$  once daily .  You should be contacted about your therapy referrals.

## 2020-10-31 NOTE — Assessment & Plan Note (Signed)
New. Rx trial of meclizine '25mg'$  PO TID.

## 2020-11-05 ENCOUNTER — Telehealth: Payer: Self-pay | Admitting: Family

## 2020-11-05 MED ORDER — ATORVASTATIN CALCIUM 40 MG PO TABS
40.0000 mg | ORAL_TABLET | Freq: Every day | ORAL | 1 refills | Status: DC
  Filled 2020-11-05 – 2021-02-12 (×4): qty 90, 90d supply, fill #0
  Filled 2021-08-14: qty 90, 90d supply, fill #1

## 2020-11-05 NOTE — Telephone Encounter (Signed)
Cholesterol and triglycerides are still elevated but better than last time.  I would like for her to increase atorvastatin from '20mg'$  to '40mg'$  once daily.

## 2020-11-06 ENCOUNTER — Encounter: Payer: Self-pay | Admitting: Family

## 2020-11-06 ENCOUNTER — Other Ambulatory Visit (HOSPITAL_BASED_OUTPATIENT_CLINIC_OR_DEPARTMENT_OTHER): Payer: Self-pay

## 2020-11-06 MED ORDER — OMEPRAZOLE 40 MG PO CPDR
40.0000 mg | DELAYED_RELEASE_CAPSULE | Freq: Every day | ORAL | 3 refills | Status: DC
  Filled 2020-11-06 – 2020-12-17 (×3): qty 30, 30d supply, fill #0
  Filled 2021-02-12: qty 30, 30d supply, fill #1
  Filled 2021-04-02: qty 30, 30d supply, fill #2
  Filled 2021-05-23 – 2021-06-04 (×2): qty 30, 30d supply, fill #3

## 2020-11-14 ENCOUNTER — Other Ambulatory Visit (HOSPITAL_BASED_OUTPATIENT_CLINIC_OR_DEPARTMENT_OTHER): Payer: Self-pay

## 2020-11-30 ENCOUNTER — Ambulatory Visit: Payer: BC Managed Care – PPO | Admitting: Family

## 2020-12-17 ENCOUNTER — Other Ambulatory Visit (HOSPITAL_COMMUNITY): Payer: Self-pay

## 2020-12-17 ENCOUNTER — Other Ambulatory Visit (HOSPITAL_BASED_OUTPATIENT_CLINIC_OR_DEPARTMENT_OTHER): Payer: Self-pay

## 2020-12-17 ENCOUNTER — Other Ambulatory Visit: Payer: Self-pay

## 2021-01-21 ENCOUNTER — Encounter: Payer: Self-pay | Admitting: Family

## 2021-01-22 ENCOUNTER — Other Ambulatory Visit: Payer: Self-pay

## 2021-01-22 ENCOUNTER — Other Ambulatory Visit (HOSPITAL_BASED_OUTPATIENT_CLINIC_OR_DEPARTMENT_OTHER): Payer: Self-pay

## 2021-01-22 MED ORDER — MELOXICAM 7.5 MG PO TABS
7.5000 mg | ORAL_TABLET | Freq: Every day | ORAL | 0 refills | Status: DC
  Filled 2021-01-22 – 2021-01-23 (×2): qty 14, 14d supply, fill #0

## 2021-01-23 ENCOUNTER — Other Ambulatory Visit (HOSPITAL_COMMUNITY): Payer: Self-pay

## 2021-01-23 ENCOUNTER — Other Ambulatory Visit: Payer: Self-pay

## 2021-01-23 ENCOUNTER — Other Ambulatory Visit (HOSPITAL_BASED_OUTPATIENT_CLINIC_OR_DEPARTMENT_OTHER): Payer: Self-pay

## 2021-01-31 ENCOUNTER — Other Ambulatory Visit: Payer: Self-pay

## 2021-02-01 ENCOUNTER — Other Ambulatory Visit: Payer: Self-pay

## 2021-02-12 ENCOUNTER — Other Ambulatory Visit: Payer: Self-pay

## 2021-02-12 ENCOUNTER — Other Ambulatory Visit (HOSPITAL_COMMUNITY): Payer: Self-pay

## 2021-02-15 ENCOUNTER — Other Ambulatory Visit: Payer: Self-pay

## 2021-02-27 ENCOUNTER — Other Ambulatory Visit: Payer: Self-pay

## 2021-03-07 ENCOUNTER — Encounter: Payer: Self-pay | Admitting: Family

## 2021-03-27 ENCOUNTER — Other Ambulatory Visit (HOSPITAL_COMMUNITY): Payer: Self-pay

## 2021-04-02 ENCOUNTER — Other Ambulatory Visit (HOSPITAL_COMMUNITY): Payer: Self-pay

## 2021-04-03 ENCOUNTER — Other Ambulatory Visit (HOSPITAL_COMMUNITY): Payer: Self-pay

## 2021-05-06 ENCOUNTER — Other Ambulatory Visit: Payer: Self-pay | Admitting: Family

## 2021-05-07 ENCOUNTER — Other Ambulatory Visit (HOSPITAL_COMMUNITY): Payer: Self-pay

## 2021-05-07 MED ORDER — GABAPENTIN 100 MG PO CAPS
100.0000 mg | ORAL_CAPSULE | Freq: Three times a day (TID) | ORAL | 0 refills | Status: DC
  Filled 2021-05-07: qty 90, 30d supply, fill #0

## 2021-05-24 ENCOUNTER — Other Ambulatory Visit (HOSPITAL_COMMUNITY): Payer: Self-pay

## 2021-06-03 ENCOUNTER — Other Ambulatory Visit (HOSPITAL_COMMUNITY): Payer: Self-pay

## 2021-06-04 ENCOUNTER — Other Ambulatory Visit (HOSPITAL_COMMUNITY): Payer: Self-pay

## 2021-06-22 ENCOUNTER — Other Ambulatory Visit: Payer: Self-pay | Admitting: Family

## 2021-06-23 MED ORDER — GABAPENTIN 100 MG PO CAPS
100.0000 mg | ORAL_CAPSULE | Freq: Three times a day (TID) | ORAL | 2 refills | Status: DC
  Filled 2021-06-23: qty 90, 30d supply, fill #0
  Filled 2021-07-22: qty 90, 30d supply, fill #1
  Filled 2022-03-01: qty 90, 30d supply, fill #2

## 2021-06-24 ENCOUNTER — Other Ambulatory Visit (HOSPITAL_COMMUNITY): Payer: Self-pay

## 2021-07-22 ENCOUNTER — Other Ambulatory Visit: Payer: Self-pay | Admitting: Family

## 2021-07-23 ENCOUNTER — Other Ambulatory Visit (HOSPITAL_COMMUNITY): Payer: Self-pay

## 2021-07-23 MED ORDER — OMEPRAZOLE 40 MG PO CPDR
40.0000 mg | DELAYED_RELEASE_CAPSULE | Freq: Every day | ORAL | 0 refills | Status: DC
  Filled 2021-07-23 – 2021-08-02 (×2): qty 30, 30d supply, fill #0

## 2021-07-23 MED ORDER — MELOXICAM 7.5 MG PO TABS
7.5000 mg | ORAL_TABLET | Freq: Every day | ORAL | 0 refills | Status: DC
  Filled 2021-07-23 – 2021-08-14 (×2): qty 14, 14d supply, fill #0

## 2021-07-31 ENCOUNTER — Other Ambulatory Visit (HOSPITAL_COMMUNITY): Payer: Self-pay

## 2021-08-01 ENCOUNTER — Other Ambulatory Visit: Payer: Self-pay

## 2021-08-01 ENCOUNTER — Encounter (HOSPITAL_COMMUNITY): Payer: Self-pay | Admitting: Emergency Medicine

## 2021-08-01 ENCOUNTER — Emergency Department (HOSPITAL_COMMUNITY)
Admission: EM | Admit: 2021-08-01 | Discharge: 2021-08-01 | Disposition: A | Discharge status: Home / Self Care | Payer: 59 | Attending: Emergency Medicine | Admitting: Emergency Medicine

## 2021-08-01 ENCOUNTER — Emergency Department (HOSPITAL_COMMUNITY): Payer: 59

## 2021-08-01 ENCOUNTER — Other Ambulatory Visit (HOSPITAL_BASED_OUTPATIENT_CLINIC_OR_DEPARTMENT_OTHER): Payer: Self-pay

## 2021-08-01 DIAGNOSIS — M542 Cervicalgia: Secondary | ICD-10-CM | POA: Insufficient documentation

## 2021-08-01 DIAGNOSIS — F0781 Postconcussional syndrome: Secondary | ICD-10-CM | POA: Insufficient documentation

## 2021-08-01 DIAGNOSIS — R109 Unspecified abdominal pain: Secondary | ICD-10-CM | POA: Diagnosis not present

## 2021-08-01 DIAGNOSIS — R739 Hyperglycemia, unspecified: Secondary | ICD-10-CM | POA: Diagnosis not present

## 2021-08-01 DIAGNOSIS — R1084 Generalized abdominal pain: Secondary | ICD-10-CM | POA: Insufficient documentation

## 2021-08-01 DIAGNOSIS — D72829 Elevated white blood cell count, unspecified: Secondary | ICD-10-CM | POA: Diagnosis not present

## 2021-08-01 DIAGNOSIS — S0990XA Unspecified injury of head, initial encounter: Secondary | ICD-10-CM | POA: Insufficient documentation

## 2021-08-01 DIAGNOSIS — R112 Nausea with vomiting, unspecified: Secondary | ICD-10-CM

## 2021-08-01 DIAGNOSIS — R519 Headache, unspecified: Secondary | ICD-10-CM | POA: Diagnosis not present

## 2021-08-01 DIAGNOSIS — Y280XXA Contact with sharp glass, undetermined intent, initial encounter: Secondary | ICD-10-CM | POA: Insufficient documentation

## 2021-08-01 DIAGNOSIS — E039 Hypothyroidism, unspecified: Secondary | ICD-10-CM | POA: Insufficient documentation

## 2021-08-01 DIAGNOSIS — R197 Diarrhea, unspecified: Secondary | ICD-10-CM | POA: Insufficient documentation

## 2021-08-01 DIAGNOSIS — I700 Atherosclerosis of aorta: Secondary | ICD-10-CM | POA: Diagnosis not present

## 2021-08-01 LAB — CBC WITH DIFFERENTIAL/PLATELET
Abs Immature Granulocytes: 0.09 10*3/uL — ABNORMAL HIGH (ref 0.00–0.07)
Basophils Absolute: 0 10*3/uL (ref 0.0–0.1)
Basophils Relative: 0 %
Eosinophils Absolute: 0.1 10*3/uL (ref 0.0–0.5)
Eosinophils Relative: 1 %
HCT: 41.1 % (ref 36.0–46.0)
Hemoglobin: 13.8 g/dL (ref 12.0–15.0)
Immature Granulocytes: 1 %
Lymphocytes Relative: 16 %
Lymphs Abs: 2.2 10*3/uL (ref 0.7–4.0)
MCH: 30.3 pg (ref 26.0–34.0)
MCHC: 33.6 g/dL (ref 30.0–36.0)
MCV: 90.3 fL (ref 80.0–100.0)
Monocytes Absolute: 0.6 10*3/uL (ref 0.1–1.0)
Monocytes Relative: 4 %
Neutro Abs: 10.7 10*3/uL — ABNORMAL HIGH (ref 1.7–7.7)
Neutrophils Relative %: 78 %
Platelets: 270 10*3/uL (ref 150–400)
RBC: 4.55 MIL/uL (ref 3.87–5.11)
RDW: 13.8 % (ref 11.5–15.5)
WBC: 13.8 10*3/uL — ABNORMAL HIGH (ref 4.0–10.5)
nRBC: 0 % (ref 0.0–0.2)

## 2021-08-01 LAB — COMPREHENSIVE METABOLIC PANEL
ALT: 25 U/L (ref 0–44)
AST: 24 U/L (ref 15–41)
Albumin: 4 g/dL (ref 3.5–5.0)
Alkaline Phosphatase: 77 U/L (ref 38–126)
Anion gap: 9 (ref 5–15)
BUN: 15 mg/dL (ref 6–20)
CO2: 26 mmol/L (ref 22–32)
Calcium: 9.6 mg/dL (ref 8.9–10.3)
Chloride: 105 mmol/L (ref 98–111)
Creatinine, Ser: 0.92 mg/dL (ref 0.44–1.00)
GFR, Estimated: >60 mL/min (ref >60)
Glucose, Bld: 136 mg/dL — ABNORMAL HIGH (ref 70–99)
Potassium: 4.3 mmol/L (ref 3.5–5.1)
Sodium: 140 mmol/L (ref 135–145)
Total Bilirubin: 0.5 mg/dL (ref 0.3–1.2)
Total Protein: 7.2 g/dL (ref 6.5–8.1)

## 2021-08-01 LAB — I-STAT CHEM 8, ED
BUN: 16 mg/dL (ref 6–20)
Calcium, Ion: 1.18 mmol/L (ref 1.15–1.40)
Chloride: 103 mmol/L (ref 98–111)
Creatinine, Ser: 0.9 mg/dL (ref 0.44–1.00)
Glucose, Bld: 132 mg/dL — ABNORMAL HIGH (ref 70–99)
HCT: 43 % (ref 36.0–46.0)
Hemoglobin: 14.6 g/dL (ref 12.0–15.0)
Potassium: 4.2 mmol/L (ref 3.5–5.1)
Sodium: 141 mmol/L (ref 135–145)
TCO2: 27 mmol/L (ref 22–32)

## 2021-08-01 LAB — I-STAT BETA HCG BLOOD, ED (MC, WL, AP ONLY): I-stat hCG, quantitative: <5 m[IU]/mL (ref <5)

## 2021-08-01 LAB — LIPASE, BLOOD: Lipase: 23 U/L (ref 11–51)

## 2021-08-01 MED ORDER — PROCHLORPERAZINE EDISYLATE 10 MG/2ML IJ SOLN
10.0000 mg | Freq: Once | INTRAMUSCULAR | Status: AC
  Administered 2021-08-01: 10 mg via INTRAVENOUS
  Filled 2021-08-01: qty 2

## 2021-08-01 MED ORDER — HYDROMORPHONE HCL 1 MG/ML IJ SOLN
0.5000 mg | Freq: Once | INTRAMUSCULAR | Status: AC
  Administered 2021-08-01: 0.5 mg via INTRAVENOUS
  Filled 2021-08-01: qty 1

## 2021-08-01 MED ORDER — IOHEXOL 300 MG/ML  SOLN
100.0000 mL | Freq: Once | INTRAMUSCULAR | Status: AC | PRN
  Administered 2021-08-01: 100 mL via INTRAVENOUS

## 2021-08-01 MED ORDER — SODIUM CHLORIDE 0.9 % IV SOLN: INTRAVENOUS | Status: DC

## 2021-08-01 MED ORDER — ONDANSETRON 4 MG PO TBDP
4.0000 mg | ORAL_TABLET | Freq: Three times a day (TID) | ORAL | 0 refills | Status: AC | PRN
  Filled 2021-08-01 – 2021-08-02 (×2): qty 15, 5d supply, fill #0

## 2021-08-01 MED ORDER — ONDANSETRON HCL 4 MG/2ML IJ SOLN
4.0000 mg | Freq: Once | INTRAMUSCULAR | Status: AC
  Administered 2021-08-01: 4 mg via INTRAVENOUS
  Filled 2021-08-01: qty 2

## 2021-08-01 MED ORDER — SODIUM CHLORIDE 0.9 % IV BOLUS
1000.0000 mL | Freq: Once | INTRAVENOUS | Status: AC
  Administered 2021-08-01: 1000 mL via INTRAVENOUS

## 2021-08-01 MED ORDER — MECLIZINE HCL 25 MG PO TABS
25.0000 mg | ORAL_TABLET | Freq: Once | ORAL | Status: AC
  Administered 2021-08-01: 25 mg via ORAL
  Filled 2021-08-01: qty 1

## 2021-08-01 MED ORDER — KETOROLAC TROMETHAMINE 15 MG/ML IJ SOLN
15.0000 mg | Freq: Once | INTRAMUSCULAR | Status: AC
  Administered 2021-08-01: 15 mg via INTRAVENOUS
  Filled 2021-08-01: qty 1

## 2021-08-01 NOTE — ED Provider Notes (Signed)
[facility] EMERGENCY DEPARTMENT Provider Note  CSN: [id number] Arrival date & time: 08/01/21 0031  Chief Complaint(s) Emesis ( Head Injury )  HPI Jessica Kemp is a 56 y.o. female with a past medical history listed below who presents to the emergency department for sudden onset nausea vomiting and diarrhea this evening.  Patient is endorsing generalized abdominal discomfort.  She denies any known sick contacts.  No suspicious food intake.   Patient reports that for the past several days she has been feeling dizzy.  This began after she had a minor head trauma were a large mirror fell on her head at home.  She reports passing now however she came to and has been performing ADLs without any complication.  She does endorse mild headache and dizziness since. Dizziness is positional.  Patient is not anticoagulated.    She denies any recent fevers or infections.  No coughing or congestion.  No chest pain or shortness of breath.  No other physical complaints.  The history is provided by the patient.   Past Medical History Past Medical History:  Diagnosis Date   Anxiety    Arthritis    neck, hands   Blood in the stool    Depression    GERD (gastroesophageal reflux disease)    Hypothyroidism    25 years ago   Incontinence in female 2012   Patient Active Problem List   Diagnosis Date Noted   Benign paroxysmal positional vertigo 10/31/2020   Cervical radiculopathy 10/31/2020   Arthritis of carpometacarpal [facility]) joint of both thumbs 10/31/2020   Depression with anxiety 10/31/2020   Carpal tunnel syndrome on left 07/11/2020   Hyperlipidemia 04/03/2020   Home Medication(s) Prior to Admission medications   Medication Sig Start Date End Date Taking? Authorizing Provider  ondansetron (ZOFRAN-ODT) 4 MG disintegrating tablet Take 1 tablet (4 mg total) by mouth every 8 (eight) hours as needed for up to 3 days for nausea or vomiting. 08/01/21 08/04/21 Yes Clide Remmers, Grayce Sessions,  MD  atorvastatin (LIPITOR) 40 MG tablet Take 1 tablet (40 mg total) by mouth daily. 11/05/20   Debbrah Alar, NP  FLUoxetine (PROZAC) 20 MG capsule Take 1 capsule by mouth daily. 10/31/20   Debbrah Alar, NP  gabapentin (NEURONTIN) 100 MG capsule Take 1 capsule (100 mg total) by mouth 3 (three) times daily. Need appt for further refills. 06/23/21   Debbrah Alar, NP  meclizine (ANTIVERT) 25 MG tablet Take 1 tablet (25 mg total) by mouth 3 (three) times daily as needed for dizziness. 10/31/20   Debbrah Alar, NP  meloxicam (MOBIC) 7.5 MG tablet Take 1 tablet (7.5 mg total) by mouth daily. 07/23/21   Debbrah Alar, NP  omeprazole (PRILOSEC) 40 MG capsule Take 1 capsule by mouth daily. 07/23/21   Debbrah Alar, NP  Allergies Patient has no known allergies.  Review of Systems Review of Systems As noted in HPI  Physical Exam Vital Signs  I have reviewed the triage vital signs BP (!) 112/56   Pulse 70   Temp (!) 97.5 F (36.4 C) (Temporal)   Resp 13   SpO2 92%   Physical Exam Vitals reviewed.  Constitutional:      General: She is not in acute distress.    Appearance: She is well-developed. She is not diaphoretic.  HENT:     Head: Normocephalic. Contusion present.      Nose: Nose normal.  Eyes:     General: No scleral icterus.       Right eye: No discharge.        Left eye: No discharge.     Conjunctiva/sclera: Conjunctivae normal.     Pupils: Pupils are equal, round, and reactive to light.  Cardiovascular:     Rate and Rhythm: Normal rate and regular rhythm.     Heart sounds: No murmur heard.   No friction rub. No gallop.  Pulmonary:     Effort: Pulmonary effort is normal. No respiratory distress.     Breath sounds: Normal breath sounds. No stridor. No rales.  Abdominal:     General: There is no distension.      Palpations: Abdomen is soft.     Tenderness: There is abdominal tenderness in the right lower quadrant, suprapubic area and left lower quadrant. There is no guarding or rebound.  Musculoskeletal:        General: No tenderness.     Cervical back: Normal range of motion and neck supple. Muscular tenderness present. No spinous process tenderness.  Skin:    General: Skin is warm and dry.     Findings: No erythema or rash.  Neurological:     Mental Status: She is alert and oriented to person, place, and time.     Comments: Mental Status:  Alert and oriented to person, place, and time.  Attention and concentration normal.  Speech clear.  Recent memory is intact  Cranial Nerves:  II Visual Fields: Intact to confrontation. Visual fields intact. III, IV, VI: Pupils equal and reactive to light and near. Full eye movement without nystagmus  V Facial Sensation: Normal. No weakness of masticatory muscles  VII: No facial weakness or asymmetry  VIII Auditory Acuity: Grossly normal  IX/X: The uvula is midline; the palate elevates symmetrically  XI: Normal sternocleidomastoid and trapezius strength  XII: The tongue is midline. No atrophy or fasciculations.   Motor System: Muscle Strength: 5/5 and symmetric in the upper and lower extremities. No pronation or drift.  Muscle Tone: Tone and muscle bulk are normal in the upper and lower extremities.  Coordination: No tremor.  Sensation: Intact to light touch Gait: deferred     ED Results and Treatments Labs (all labs ordered are listed, but only abnormal results are displayed) Labs Reviewed  COMPREHENSIVE METABOLIC PANEL - Abnormal; Notable for the following components:      Result Value   Glucose, Bld 136 (*)    All other components within normal limits  CBC WITH DIFFERENTIAL/PLATELET - Abnormal; Notable for the following components:   WBC 13.8 (*)    Neutro Abs 10.7 (*)    Abs Immature Granulocytes 0.09 (*)    All other components within  normal limits  I-STAT CHEM 8, ED - Abnormal; Notable for the following components:   Glucose, Bld 132 (*)    All other  components within normal limits  LIPASE, BLOOD  I-STAT BETA HCG BLOOD, ED ([facility], [facility], [facility] ONLY)                                                                                                                         EKG  EKG Interpretation  Date/Time:    Ventricular Rate:    PR Interval:    QRS Duration:   QT Interval:    QTC Calculation:   R Axis:     Text Interpretation:         Radiology CT Head Wo Contrast  Result Date: 08/01/2021 CLINICAL DATA:  Head injury 2 days ago with persistent head and neck pain since then. EXAM: CT HEAD WITHOUT CONTRAST CT CERVICAL SPINE WITHOUT CONTRAST TECHNIQUE: Multidetector CT imaging of the head and cervical spine was performed following the standard protocol without intravenous contrast. Multiplanar CT image reconstructions of the cervical spine were also generated. RADIATION DOSE REDUCTION: This exam was performed according to the departmental dose-optimization program which includes automated exposure control, adjustment of the mA and/or kV according to patient size and/or use of iterative reconstruction technique. COMPARISON:  No prior head CT or MRI. Comparison is made with preoperative MRI cervical spine 07/17/2020, postoperative cervical spine plain films 09/17/2020 FINDINGS: CT HEAD FINDINGS Brain: Motion artifact limits the study. There are no findings of significant atrophy or visible small-vessel disease. No obvious acute infarct, hemorrhage or mass are seen through the motion artifact. The ventricles are normal in size and position. Vascular: No hyperdense vessel or unexpected calcification. Skull: No depressed fracture or skull lesion is seen. There is no visible scalp hematoma but there is mild swelling in the left frontal scalp which could be due to scalp contusion. Sinuses/Orbits: There is scattered membrane thickening in the  ethmoids. Other visible sinuses, bilateral mastoid air cells are clear accounting for motion. Other: None. CT CERVICAL SPINE FINDINGS Alignment: Straightened but otherwise normal allowing for motion artifact. Skull base and vertebrae: Motion artifact limits the exam. No compression fracture or displaced fracture is seen but a subtle fracture could be missed. There is normal bone mineralization with no primary bone lesions noted. Again noted is anterior plate fusion hardware with interbody metallic disc space apparatus from C4-7. Disc spaces are still visualized peripheral to the interbody metal hardware. Soft tissues and spinal canal: No prevertebral fluid or swelling. No visible canal hematoma. There are small calcifications in both carotid bifurcations. Disc levels: There is normal disc height at C2-3, mild partial disc space loss C3-4 and C7-T1. Remaining cervical discs with fusion hardware. Posterior osteophytes at the level of the disc fusions at C5-6 and C6-7 encroach on the ventral thecal sac and ventral cord surface without obvious cord compression. Primarily due to uncinate joint spurring, foraminal stenosis is noted which is bilaterally mild-to-moderate at C3-4, moderate on the left at C4-5, bilaterally moderate to severe C5-6 and moderate on the left and moderate to severe on the right at C6-7. Other  levels do not show significant soft tissue or bony encroachment on the foramina or the thecal sac. Upper chest: Negative. Other: None. IMPRESSION: 1. Motion limited exam grossly negative for acute intracranial process. Follow-up with MRI when clinically feasible if there is continued concern. 2. Left frontal scalp contusion without evidence of depressed skull fractures. 3. Degenerative and postsurgical changes of the cervical spine without obvious fracture or malalignment on motion limited cervical spine CT. Electronically Signed   By: Telford Nab M.D.   On: 08/01/2021 03:30   CT Cervical Spine Wo  Contrast  Result Date: 08/01/2021 CLINICAL DATA:  Head injury 2 days ago with persistent head and neck pain since then. EXAM: CT HEAD WITHOUT CONTRAST CT CERVICAL SPINE WITHOUT CONTRAST TECHNIQUE: Multidetector CT imaging of the head and cervical spine was performed following the standard protocol without intravenous contrast. Multiplanar CT image reconstructions of the cervical spine were also generated. RADIATION DOSE REDUCTION: This exam was performed according to the departmental dose-optimization program which includes automated exposure control, adjustment of the mA and/or kV according to patient size and/or use of iterative reconstruction technique. COMPARISON:  No prior head CT or MRI. Comparison is made with preoperative MRI cervical spine 07/17/2020, postoperative cervical spine plain films 09/17/2020 FINDINGS: CT HEAD FINDINGS Brain: Motion artifact limits the study. There are no findings of significant atrophy or visible small-vessel disease. No obvious acute infarct, hemorrhage or mass are seen through the motion artifact. The ventricles are normal in size and position. Vascular: No hyperdense vessel or unexpected calcification. Skull: No depressed fracture or skull lesion is seen. There is no visible scalp hematoma but there is mild swelling in the left frontal scalp which could be due to scalp contusion. Sinuses/Orbits: There is scattered membrane thickening in the ethmoids. Other visible sinuses, bilateral mastoid air cells are clear accounting for motion. Other: None. CT CERVICAL SPINE FINDINGS Alignment: Straightened but otherwise normal allowing for motion artifact. Skull base and vertebrae: Motion artifact limits the exam. No compression fracture or displaced fracture is seen but a subtle fracture could be missed. There is normal bone mineralization with no primary bone lesions noted. Again noted is anterior plate fusion hardware with interbody metallic disc space apparatus from C4-7. Disc  spaces are still visualized peripheral to the interbody metal hardware. Soft tissues and spinal canal: No prevertebral fluid or swelling. No visible canal hematoma. There are small calcifications in both carotid bifurcations. Disc levels: There is normal disc height at C2-3, mild partial disc space loss C3-4 and C7-T1. Remaining cervical discs with fusion hardware. Posterior osteophytes at the level of the disc fusions at C5-6 and C6-7 encroach on the ventral thecal sac and ventral cord surface without obvious cord compression. Primarily due to uncinate joint spurring, foraminal stenosis is noted which is bilaterally mild-to-moderate at C3-4, moderate on the left at C4-5, bilaterally moderate to severe C5-6 and moderate on the left and moderate to severe on the right at C6-7. Other levels do not show significant soft tissue or bony encroachment on the foramina or the thecal sac. Upper chest: Negative. Other: None. IMPRESSION: 1. Motion limited exam grossly negative for acute intracranial process. Follow-up with MRI when clinically feasible if there is continued concern. 2. Left frontal scalp contusion without evidence of depressed skull fractures. 3. Degenerative and postsurgical changes of the cervical spine without obvious fracture or malalignment on motion limited cervical spine CT. Electronically Signed   By: Telford Nab M.D.   On: 08/01/2021 03:30   CT ABDOMEN  PELVIS W CONTRAST  Result Date: 08/01/2021 CLINICAL DATA:  Left lower quadrant pain. EXAM: CT ABDOMEN AND PELVIS WITH CONTRAST TECHNIQUE: Multidetector CT imaging of the abdomen and pelvis was performed using the standard protocol following bolus administration of intravenous contrast. RADIATION DOSE REDUCTION: This exam was performed according to the departmental dose-optimization program which includes automated exposure control, adjustment of the mA and/or kV according to patient size and/or use of iterative reconstruction technique. CONTRAST:   131m OMNIPAQUE IOHEXOL 300 MG/ML  SOLN COMPARISON:  CT abdomen and pelvis 07/22/2018 FINDINGS: Lower chest: No acute abnormality. Hepatobiliary: There is diffuse fatty infiltration of the liver. Gallbladder and bile ducts are within normal limits. Pancreas: Unremarkable. No pancreatic ductal dilatation or surrounding inflammatory changes. Spleen: Normal in size without focal abnormality. Adrenals/Urinary Tract: Adrenal glands are unremarkable. Kidneys are normal, without renal calculi, focal lesion, or hydronephrosis. Bladder is unremarkable. Stomach/Bowel: Stomach is within normal limits. Appendix appears normal. No evidence of bowel wall thickening, distention, or inflammatory changes. Vascular/Lymphatic: Aortic atherosclerosis. No enlarged abdominal or pelvic lymph nodes. Reproductive: Status post hysterectomy. No adnexal masses. Other: No abdominal wall hernia or abnormality. No abdominopelvic ascites. Musculoskeletal: No acute or significant osseous findings. IMPRESSION: 1. No acute process in the abdomen or pelvis. 2. Hepatic steatosis. 3.  Aortic Atherosclerosis (ICD10-I70.0). Electronically Signed   By: ARonney AstersM.D.   On: 08/01/2021 03:13    Pertinent labs & imaging results that were available during my care of the patient were reviewed by me and considered in my medical decision making (see MDM for details).  Medications Ordered in ED Medications  sodium chloride 0.9 % bolus 1,000 mL (0 mLs Intravenous Stopped 08/01/21 0311)    And  0.9 %  sodium chloride infusion (0 mLs Intravenous Stopped 08/01/21 0458)  prochlorperazine (COMPAZINE) injection 10 mg (10 mg Intravenous Given 08/01/21 0123)  HYDROmorphone (DILAUDID) injection 0.5 mg (0.5 mg Intravenous Given 08/01/21 0123)  iohexol (OMNIPAQUE) 300 MG/ML solution 100 mL (100 mLs Intravenous Contrast Given 08/01/21 0255)  meclizine (ANTIVERT) tablet 25 mg (25 mg Oral Given 08/01/21 0347)  ondansetron (ZOFRAN) injection 4 mg (4 mg Intravenous Given  08/01/21 0346)  ketorolac (TORADOL) 15 MG/ML injection 15 mg (15 mg Intravenous Given 08/01/21 0346)                                                                                                                                     Procedures Procedures  (including critical care time)  Medical Decision Making / ED Course    Complexity of Problem:  Patient's presenting problem/concern, DDX, and MDM listed below: Abdominal pain with nausea, vomiting and diarrhea. We will assess for intra-abdominal inflammatory/infectious process.  Low suspicion for bowel obstruction. Minor head trauma resulting in headache and dizziness Considering possible postconcussive syndrome. Given the dizziness and emesis, will obtain CT head to rule out ICH. We will also obtain CT of the cervical spine to  rule out fracture.  Hospitalization Considered:  Yes  Initial Intervention:  IV fluids and antiemetics    Complexity of Data:   Cardiac Monitoring: The patient was maintained on a cardiac monitor.   I personally viewed and interpreted the cardiac monitored which showed an underlying rhythm of sinus rhythm with rates in the 70s  Laboratory Tests ordered listed below with my independent interpretation: CBC with leukocytosis.  No anemia No significant electrolyte derangements or renal sufficiency.  Mild hyperglycemia without evidence of DKA. No evidence of bili obstruction or pancreatitis.   Imaging Studies ordered listed below with my independent interpretation: CT head and cervical spine without evidence of ICH, obvious fracture or dislocation. CT of the abdomen without evidence of enteritis or colitis.     ED Course:    Assessment, Add'l Intervention, and Reassessment: Abd pain with N/V/D Work-up was reassuring without serious intra-abdominal inflammatory/infectious process.  Likely functional or viral process. She was provided with IV fluids and antiemetics.  Able to tolerate oral  intake.  Headache/dizziness Work-up negative for ICH.  Exam was nonfocal concerning for CVA. Symptoms improved with meclizine and Toradol.  Final Clinical Impression(s) / ED Diagnoses Final diagnoses:  Minor head injury, initial encounter  Post concussion syndrome  Nausea vomiting and diarrhea   The patient appears reasonably screened and/or stabilized for discharge and I doubt any other medical condition or other [facility] requiring further screening, evaluation, or treatment in the ED at this time prior to discharge. Safe for discharge with strict return precautions.  Disposition: Discharge  Condition: Good  I have discussed the results, Dx and Tx plan with the patient/family who expressed understanding and agree(s) with the plan. Discharge instructions discussed at length. The patient/family was given strict return precautions who verbalized understanding of the instructions. No further questions at time of discharge.    ED Discharge Orders          Ordered    ondansetron (ZOFRAN-ODT) 4 MG disintegrating tablet  Every 8 hours PRN        08/01/21 0503             Follow Up: Debbrah Alar, NP [place] [address] [phone]  Call  to schedule an appointment for close follow up           This chart was dictated using voice recognition software.  Despite best efforts to proofread,  errors can occur which can change the documentation meaning.    Fatima Blank, MD 08/01/21 [phone]

## 2021-08-01 NOTE — ED Triage Notes (Signed)
Patient reports persistent nausea and vomitting for 2 days after head injury ( hit by a mirror) 2 days ago , received Zofran IM by EMS with relief. Alert and oriented.

## 2021-08-02 ENCOUNTER — Other Ambulatory Visit (HOSPITAL_COMMUNITY): Payer: Self-pay

## 2021-08-02 ENCOUNTER — Other Ambulatory Visit (HOSPITAL_BASED_OUTPATIENT_CLINIC_OR_DEPARTMENT_OTHER): Payer: Self-pay

## 2021-08-15 ENCOUNTER — Other Ambulatory Visit (HOSPITAL_COMMUNITY): Payer: Self-pay

## 2021-08-21 ENCOUNTER — Other Ambulatory Visit: Payer: Self-pay | Admitting: Family

## 2021-08-23 ENCOUNTER — Other Ambulatory Visit (HOSPITAL_COMMUNITY): Payer: Self-pay

## 2021-08-26 ENCOUNTER — Other Ambulatory Visit (HOSPITAL_COMMUNITY): Payer: Self-pay

## 2021-10-10 ENCOUNTER — Ambulatory Visit: Payer: BLUE CROSS/BLUE SHIELD | Admitting: Family

## 2021-12-06 ENCOUNTER — Ambulatory Visit: Payer: 59 | Admitting: Family

## 2021-12-06 ENCOUNTER — Encounter: Payer: Self-pay | Admitting: Family

## 2021-12-06 ENCOUNTER — Other Ambulatory Visit (HOSPITAL_BASED_OUTPATIENT_CLINIC_OR_DEPARTMENT_OTHER): Payer: Self-pay

## 2021-12-06 VITALS — BP 124/82 | HR 97 | Temp 98.1°F | Resp 18 | Ht 61.0 in

## 2021-12-06 DIAGNOSIS — Z1231 Encounter for screening mammogram for malignant neoplasm of breast: Secondary | ICD-10-CM | POA: Diagnosis not present

## 2021-12-06 DIAGNOSIS — E785 Hyperlipidemia, unspecified: Secondary | ICD-10-CM | POA: Diagnosis not present

## 2021-12-06 DIAGNOSIS — M5412 Radiculopathy, cervical region: Secondary | ICD-10-CM | POA: Diagnosis not present

## 2021-12-06 DIAGNOSIS — F418 Other specified anxiety disorders: Secondary | ICD-10-CM

## 2021-12-06 DIAGNOSIS — L989 Disorder of the skin and subcutaneous tissue, unspecified: Secondary | ICD-10-CM | POA: Diagnosis not present

## 2021-12-06 DIAGNOSIS — M79645 Pain in left finger(s): Secondary | ICD-10-CM

## 2021-12-06 DIAGNOSIS — M79644 Pain in right finger(s): Secondary | ICD-10-CM | POA: Diagnosis not present

## 2021-12-06 DIAGNOSIS — M180 Bilateral primary osteoarthritis of first carpometacarpal joints: Secondary | ICD-10-CM | POA: Diagnosis not present

## 2021-12-06 DIAGNOSIS — G8929 Other chronic pain: Secondary | ICD-10-CM

## 2021-12-06 DIAGNOSIS — R69 Illness, unspecified: Secondary | ICD-10-CM | POA: Diagnosis not present

## 2021-12-06 LAB — LIPID PANEL
Cholesterol: 308 mg/dL — ABNORMAL HIGH (ref 0–200)
HDL: 37.1 mg/dL — ABNORMAL LOW (ref >39.00)
NonHDL: 271.35
Total CHOL/HDL Ratio: 8
Triglycerides: 352 mg/dL — ABNORMAL HIGH (ref 0.0–149.0)
VLDL: 70.4 mg/dL — ABNORMAL HIGH (ref 0.0–40.0)

## 2021-12-06 LAB — LDL CHOLESTEROL, DIRECT: Direct LDL: 183 mg/dL

## 2021-12-06 MED ORDER — ONDANSETRON HCL 4 MG PO TABS
4.0000 mg | ORAL_TABLET | Freq: Three times a day (TID) | ORAL | 0 refills | Status: DC | PRN
  Filled 2021-12-06: qty 20, 7d supply, fill #0

## 2021-12-06 MED ORDER — OMEPRAZOLE 40 MG PO CPDR
40.0000 mg | DELAYED_RELEASE_CAPSULE | Freq: Every day | ORAL | 1 refills | Status: DC
  Filled 2021-12-06: qty 90, 90d supply, fill #0
  Filled 2022-03-01: qty 90, 90d supply, fill #1

## 2021-12-06 MED ORDER — SCOPOLAMINE 1 MG/3DAYS TD PT72
1.0000 | MEDICATED_PATCH | TRANSDERMAL | 0 refills | Status: DC
  Filled 2021-12-06 – 2021-12-10 (×2): qty 1, 3d supply, fill #0

## 2021-12-06 MED ORDER — MELOXICAM 7.5 MG PO TABS
7.5000 mg | ORAL_TABLET | Freq: Every day | ORAL | 0 refills | Status: DC
  Filled 2021-12-06: qty 30, 30d supply, fill #0

## 2021-12-06 MED ORDER — MECLIZINE HCL 25 MG PO TABS
25.0000 mg | ORAL_TABLET | Freq: Three times a day (TID) | ORAL | 0 refills | Status: DC | PRN
  Filled 2021-12-06: qty 30, 10d supply, fill #0

## 2021-12-06 NOTE — Assessment & Plan Note (Signed)
Uncontrolled. Restart meloxicam once daily prn. Refer to ortho for further evaluation and possible joint injections.

## 2021-12-06 NOTE — Assessment & Plan Note (Signed)
Not currently on statin.  Repeat labs as below.

## 2021-12-06 NOTE — Assessment & Plan Note (Signed)
Stable off of prozac.  Monitor.

## 2021-12-06 NOTE — Progress Notes (Signed)
Subjective:   By signing my name below, I, Jessica Kemp, attest that this documentation has been prepared under the direction and in the presence of Karie Chimera, NP 12/06/2021    Patient ID: Jessica Kemp, female    DOB: 1965-11-21, 56 y.o.   MRN: [id number]  Chief Complaint  Patient presents with   Arthritis   Follow-up    HPI Patient is in today for an office visit   Refill: She is requesting a refill of 40 Mg of Omeprazole  Arthritis: Patient is complaining of arthritis in hands. It is painful picking up a water bottle. Her arthritis gloves to relieve the pain. The gabapentin did help some but she does not wish to continue the medication. She reports to have arm and hand weakness.   Dizziness: Patients was diagnosed with a prolonged concussion and has been experiencing dizziness and is requesting medication for it.   Nausea: She reports of experiencing nausea and is requesting antinausea pills   Dermatologist: She is requesting to be referred to a dermatologist to look at several skin spots that she has developed.   Mammogram: She has not had a mammogram done.  Immunizations: She is requesting an influenza vaccine. She is not interested in an HIV/HepC screening.   Health Maintenance Due  Topic Date Due   TETANUS/TDAP  Never done   MAMMOGRAM  Never done   COVID-19 Vaccine (3 - Moderna series) 06/05/2020   Zoster Vaccines- Shingrix (2 of 2) 12/26/2020   INFLUENZA VACCINE  10/01/2021    Past Medical History:  Diagnosis Date   Anxiety    Arthritis    neck, hands   Blood in the stool    Depression    GERD (gastroesophageal reflux disease)    Hypothyroidism    25 years ago   Incontinence in female 2012    Past Surgical History:  Procedure Laterality Date   ANTERIOR CERVICAL DECOMP/DISCECTOMY FUSION  08/2020   C4-5 C5-6 C6-7 ACD   CARPAL TUNNEL RELEASE Left 07/11/2020   Procedure: LEFT CARPAL TUNNEL RELEASE;  Surgeon: Leandrew Koyanagi, MD;  Location:  [facility];  Service: Orthopedics;  Laterality: Left;   [place]   PARTIAL HYSTERECTOMY  2012    Family History  Problem Relation Age of Onset   Leukemia Mother    Diabetes Mother    Hyperlipidemia Mother    Stroke Mother    Arthritis Father    Diabetes Father    Hypertension Father    Stroke Father    Alcohol abuse Brother    Arthritis Brother    Hypertension Brother    Heart disease Maternal Grandmother    Heart attack Maternal Grandmother    Heart attack Maternal Grandfather    Cancer Paternal Grandmother    Leukemia Paternal Grandfather    Lymphoma Son    Colon cancer Neg Hx    Esophageal cancer Neg Hx    Stomach cancer Neg Hx    Rectal cancer Neg Hx     Social History   Socioeconomic History   Marital status: Divorced    Spouse name: Not on file   Number of children: 3   Years of education: Not on file   Highest education level: Not on file  Occupational History   Occupation: Medical illustrator  Tobacco Use   Smoking status: Former    Types: Cigarettes    Quit date: 03/03/2015    Years since quitting: 6.7   Smokeless  tobacco: Never  Vaping Use   Vaping Use: Former   Start date: 03/04/2015   Quit date: 04/04/2015  Substance and Sexual Activity   Alcohol use: No   Drug use: Yes    Types: Marijuana    Comment: last smoked 5-96mo ago   Sexual activity: Not Currently    Birth control/protection: Surgical  Other Topics Concern   Not on file  Social History Narrative   Lives with her dog   2 sons   1 daughter   6 grandchildren (one son local) [place]  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    Social Determinants of Health   Financial Resource Strain: Low Risk  (03/17/2018)   Overall Financial Resource Strain (CARDIA)    Difficulty of Paying Living Expenses: Not hard at all  Food Insecurity: No Food Insecurity (03/17/2018)   Hunger Vital Sign    Worried About Running Out of Food in  the Last Year: Never true    Ran Out of Food in the Last Year: Never true  Transportation Needs: No Transportation Needs (03/17/2018)   PRAPARE - THydrologist(Medical): No    Lack of Transportation (Non-Medical): No  Physical Activity: Sufficiently Active (03/17/2018)   Exercise Vital Sign    Days of Exercise per Week: 7 days    Minutes of Exercise per Session: 50 min  Stress: Not on file  Social Connections: Moderately Isolated (03/17/2018)   Social Connection and Isolation Panel [NHANES]    Frequency of Communication with Friends and Family: Three times a week    Frequency of Social Gatherings with Friends and Family: Once a week    Attends Religious Services: Never    AMarine scientistor Organizations: No    Attends CArchivistMeetings: Never    Marital Status: Divorced  IHuman resources officerViolence: Not on file    Outpatient Medications Prior to Visit  Medication Sig Dispense Refill   atorvastatin (LIPITOR) 40 MG tablet Take 1 tablet (40 mg total) by mouth daily. 90 tablet 1   gabapentin (NEURONTIN) 100 MG capsule Take 1 capsule (100 mg total) by mouth 3 (three) times daily. Need appt for further refills. 90 capsule 2   FLUoxetine (PROZAC) 20 MG capsule Take 1 capsule by mouth daily. 90 capsule 3   meclizine (ANTIVERT) 25 MG tablet Take 1 tablet (25 mg total) by mouth 3 (three) times daily as needed for dizziness. 30 tablet 0   meloxicam (MOBIC) 7.5 MG tablet Take 1 tablet (7.5 mg total) by mouth daily. 14 tablet 0   omeprazole (PRILOSEC) 40 MG capsule Take 1 capsule by mouth daily. 30 capsule 0   No facility-administered medications prior to visit.    No Known Allergies  ROS See HPI    Objective:    Physical Exam Constitutional:      General: She is not in acute distress.    Appearance: Normal appearance. She is not ill-appearing.  HENT:     Head: Normocephalic and atraumatic.     Right Ear: External ear normal.     Left Ear:  External ear normal.  Eyes:     Extraocular Movements: Extraocular movements intact.     Pupils: Pupils are equal, round, and reactive to light.  Cardiovascular:     Rate and Rhythm: Normal rate and regular rhythm.     Heart sounds: Normal heart sounds. No murmur heard.  No gallop.  Pulmonary:     Effort: Pulmonary effort is normal. No respiratory distress.     Breath sounds: Normal breath sounds. No wheezing or rales.  Skin:    General: Skin is warm and dry.  Neurological:     Mental Status: She is alert and oriented to person, place, and time.  Psychiatric:        Mood and Affect: Mood normal.        Behavior: Behavior normal.        Judgment: Judgment normal.     BP 124/82 (BP Location: Left Arm, Patient Position: Sitting, Cuff Size: Large)   Pulse 97   Temp 98.1 F (36.7 C) (Oral)   Resp 18   Ht '5\' 1"'$  (1.549 m)   SpO2 93%   BMI 35.14 kg/m  Wt Readings from Last 3 Encounters:  10/31/20 186 lb (84.4 kg)  07/24/20 188 lb (85.3 kg)  07/11/20 188 lb 15 oz (85.7 kg)       Assessment & Plan:   Problem List Items Addressed This Visit       Unprioritized   Hyperlipidemia    Not currently on statin.  Repeat labs as below.       Relevant Orders   Lipid panel   Depression with anxiety    Stable off of prozac.  Monitor.       Cervical radiculopathy    Clinically resolved. Reports ongoing bilateral UE weakness which is likely due to myelomalacia noted on MRI C spine.       Arthritis of carpometacarpal (CMC) joint of both thumbs    Uncontrolled. Restart meloxicam once daily prn. Refer to ortho for further evaluation and possible joint injections.       Relevant Medications   meloxicam (MOBIC) 7.5 MG tablet   Other Visit Diagnoses     Chronic thumb pain, bilateral    -  Primary   Relevant Medications   meloxicam (MOBIC) 7.5 MG tablet   Other Relevant Orders   Ambulatory referral to Orthopedics   Skin lesion       Relevant Orders   Ambulatory referral to  Dermatology   Encounter for screening mammogram for malignant neoplasm of breast       Relevant Orders   MM 3D SCREEN BREAST BILATERAL       Meds ordered this encounter  Medications   omeprazole (PRILOSEC) 40 MG capsule    Sig: Take 1 capsule by mouth daily.    Dispense:  90 capsule    Refill:  1    Order Specific Question:   Supervising Provider    Answer:   Penni Homans A [[id number]]   meloxicam (MOBIC) 7.5 MG tablet    Sig: Take 1 tablet (7.5 mg total) by mouth daily.    Dispense:  30 tablet    Refill:  0    Order Specific Question:   Supervising Provider    Answer:   Penni Homans A [[id number]]   ondansetron (ZOFRAN) 4 MG tablet    Sig: Take 1 tablet (4 mg total) by mouth every 8 (eight) hours as needed for nausea or vomiting.    Dispense:  20 tablet    Refill:  0    Order Specific Question:   Supervising Provider    Answer:   Penni Homans A [[id number]]   scopolamine (TRANSDERM-SCOP) 1 MG/3DAYS    Sig: Place 1 patch (1.5 mg total) onto the skin every 3 (three) days.    Dispense:  1 patch    Refill:  0    Order Specific Question:   Supervising Provider    Answer:   Penni Homans A [[id number]]   meclizine (ANTIVERT) 25 MG tablet    Sig: Take 1 tablet (25 mg total) by mouth 3 (three) times daily as needed for dizziness.    Dispense:  30 tablet    Refill:  0    Order Specific Question:   Supervising Provider    Answer:   Penni Homans A [[id number]]    I, Nance Pear, NP, personally preformed the services described in this documentation.  All medical record entries made by the scribe were at my direction and in my presence.  I have reviewed the chart and discharge instructions (if applicable) and agree that the record reflects my personal performance and is accurate and complete. 12/06/2021   I,Amber Collins,acting as a scribe for Nance Pear, NP.,have documented all relevant documentation on the behalf of Nance Pear, NP,as directed by  Nance Pear, NP while  in the presence of Nance Pear, NP.      Nance Pear, NP

## 2021-12-06 NOTE — Assessment & Plan Note (Signed)
Clinically resolved. Reports ongoing bilateral UE weakness which is likely due to myelomalacia noted on MRI C spine.

## 2021-12-09 ENCOUNTER — Other Ambulatory Visit (HOSPITAL_BASED_OUTPATIENT_CLINIC_OR_DEPARTMENT_OTHER): Payer: Self-pay

## 2021-12-09 ENCOUNTER — Telehealth: Payer: Self-pay | Admitting: Family

## 2021-12-09 NOTE — Telephone Encounter (Signed)
Called but no answer, lvm for patient to call back 

## 2021-12-09 NOTE — Telephone Encounter (Signed)
Please advise pt that her cholesterol has worsened a lot since she stopped her statin. Was 225 and now up to 308.  Her triglycerides have worsened as well. I would like for her to restart atorvastatin.

## 2021-12-10 ENCOUNTER — Other Ambulatory Visit (HOSPITAL_BASED_OUTPATIENT_CLINIC_OR_DEPARTMENT_OTHER): Payer: Self-pay

## 2021-12-11 ENCOUNTER — Other Ambulatory Visit (HOSPITAL_BASED_OUTPATIENT_CLINIC_OR_DEPARTMENT_OTHER): Payer: Self-pay

## 2021-12-11 NOTE — Telephone Encounter (Signed)
Patient called back and had a rude tone throughout our conversation. She will speak on top of me while I was talking and will not letting me finish my sentences. She ask that we not call ger sister again when trying to reach her (sister is listed as her emergency contact) she also said she called me twice yesterday but I did not receive any calls and there is not any documentation of a call back. She said she saw results on MyChart and "will not take any medication". I told her that was Melissa's advise, to start medication due to the significant increase in the cholesterol and triglycerides levels. She still expressed refusing to take medication for this as advised.

## 2021-12-11 NOTE — Telephone Encounter (Signed)
Called again but no answer and patient has not call back yet. Left message with emergency contact, sister, Mildred Tuccillo for patient to call back

## 2021-12-19 ENCOUNTER — Telehealth (HOSPITAL_BASED_OUTPATIENT_CLINIC_OR_DEPARTMENT_OTHER): Payer: Self-pay

## 2022-03-04 ENCOUNTER — Other Ambulatory Visit (HOSPITAL_COMMUNITY): Payer: Self-pay

## 2022-03-07 ENCOUNTER — Other Ambulatory Visit: Payer: Self-pay

## 2022-03-18 ENCOUNTER — Ambulatory Visit: Payer: 59 | Admitting: Orthopaedic Surgery

## 2022-03-25 ENCOUNTER — Encounter: Payer: Self-pay | Admitting: Family

## 2022-03-25 DIAGNOSIS — L989 Disorder of the skin and subcutaneous tissue, unspecified: Secondary | ICD-10-CM

## 2022-04-10 ENCOUNTER — Ambulatory Visit: Payer: 59 | Admitting: Orthopaedic Surgery

## 2022-05-08 ENCOUNTER — Ambulatory Visit: Payer: No Typology Code available for payment source | Admitting: Orthopaedic Surgery

## 2022-06-06 ENCOUNTER — Ambulatory Visit: Payer: No Typology Code available for payment source | Admitting: Orthopaedic Surgery

## 2022-07-17 ENCOUNTER — Ambulatory Visit: Payer: Self-pay | Admitting: Physician Assistant

## 2022-12-08 ENCOUNTER — Ambulatory Visit: Payer: No Typology Code available for payment source | Admitting: Family

## 2022-12-30 ENCOUNTER — Ambulatory Visit: Payer: No Typology Code available for payment source | Admitting: Orthopaedic Surgery

## 2023-04-06 ENCOUNTER — Encounter: Payer: Self-pay | Admitting: Internal Medicine

## 2023-04-06 ENCOUNTER — Other Ambulatory Visit: Payer: Self-pay | Admitting: Family

## 2023-04-06 ENCOUNTER — Ambulatory Visit: Payer: Self-pay | Admitting: Family

## 2023-04-06 ENCOUNTER — Telehealth (INDEPENDENT_AMBULATORY_CARE_PROVIDER_SITE_OTHER): Payer: BC Managed Care – PPO | Admitting: Internal Medicine

## 2023-04-06 ENCOUNTER — Other Ambulatory Visit (HOSPITAL_COMMUNITY): Payer: Self-pay

## 2023-04-06 VITALS — Ht 61.0 in

## 2023-04-06 DIAGNOSIS — E785 Hyperlipidemia, unspecified: Secondary | ICD-10-CM

## 2023-04-06 DIAGNOSIS — B349 Viral infection, unspecified: Secondary | ICD-10-CM

## 2023-04-06 MED ORDER — ATORVASTATIN CALCIUM 40 MG PO TABS
40.0000 mg | ORAL_TABLET | Freq: Every day | ORAL | 0 refills | Status: DC
  Filled 2023-04-06: qty 30, 30d supply, fill #0

## 2023-04-06 MED ORDER — AZITHROMYCIN 250 MG PO TABS: ORAL_TABLET | ORAL | 0 refills | Status: DC

## 2023-04-06 NOTE — Progress Notes (Unsigned)
Subjective:    Patient ID: Jessica Kemp, female    DOB: 1965-10-28, 58 y.o.   MRN: [id number]  DOS:  04/06/2023 Type of visit - description: Virtual Visit via Video Note  I connected with the above patient  by a video enabled telemedicine application and verified that I am speaking with the correct person using two identifiers.   Location of patient: home  Location of provider: office  Persons participating in the virtual visit: patient, provider   I discussed the limitations of evaluation and management by telemedicine and the availability of in person appointments. The patient expressed understanding and agreed to proceed.  Acute Symptoms started 4 days ago: Fever up to 102.0, on and off, decreased with ibuprofen. Today for the first time her temperature in the morning was 99.0 and she has not taking ibuprofen or Tylenol. Cough, described as deep with abundant green sputum. + Head congestion Taking Mucinex with some relief. Her grandchildren were sick with respiratory illnesses, 1 was diagnosed with pneumonia.  Denies chest pain, gets slightly winded but no major shortness of breath. No nausea vomiting. Had some myalgias at first. Appetite is poor.     Review of Systems See above   Past Medical History:  Diagnosis Date   Anxiety    Arthritis    neck, hands   Blood in the stool    Depression    GERD (gastroesophageal reflux disease)    Hypothyroidism    25 years ago   Incontinence in female 2012    Past Surgical History:  Procedure Laterality Date   ANTERIOR CERVICAL DECOMP/DISCECTOMY FUSION  08/2020   C4-5 C5-6 C6-7 ACD   CARPAL TUNNEL RELEASE Left 07/11/2020   Procedure: LEFT CARPAL TUNNEL RELEASE;  Surgeon: Tarry Kos, MD;  Location: [facility];  Service: Orthopedics;  Laterality: Left;   CESAREAN SECTION  1995   PARTIAL HYSTERECTOMY  2012    Current Outpatient Medications  Medication Instructions   atorvastatin (LIPITOR) 40 mg,  Oral, Daily   gabapentin (NEURONTIN) 100 MG capsule Take 1 capsule (100 mg total) by mouth 3 (three) times daily. Need appt for further refills.   meclizine (ANTIVERT) 25 mg, Oral, 3 times daily PRN   meloxicam (MOBIC) 7.5 mg, Oral, Daily   omeprazole (PRILOSEC) 40 MG capsule Take 1 capsule by mouth daily.   ondansetron (ZOFRAN) 4 mg, Oral, Every 8 hours PRN   scopolamine (TRANSDERM-SCOP) 1.5 mg, Transdermal, every 72 hours       Objective:   Physical Exam Ht 5\' 1"  (1.549 m)   BMI 35.14 kg/m  This is a virtual video visit, she looks well, no vital signs were O2 sats available      Assessment    Viral syndrome Patient is 58 years old, on no medications, having respiratory symptoms for 4 days. Had a COVID test yesterday: Negative.  She is hesitant to be tested again. Plan: Conservative treatment, start antibiotics in few days only if she is not getting better. Detailed instructions sent via MyChart.  High cholesterol: Reports she is not taking atorvastatin, encouraged to discuss with PCP    I discussed the assessment and treatment plan with the patient. The patient was provided an opportunity to ask questions and all were answered. The patient agreed with the plan and demonstrated an understanding of the instructions.   The patient was advised to call back or seek an in-person evaluation if the symptoms worsen or if the condition fails to improve as  anticipated.

## 2023-04-06 NOTE — Telephone Encounter (Signed)
  Chief Complaint: SOB Symptoms: fever, no appetite, productive cough Frequency: comes and goes   Disposition: [] ED /[] Urgent Care (no appt availability in office) / [x] Appointment(In office/virtual)/ []  [facility]/ [] Home Care/ [] Refused Recommended Disposition /[]  Mobile Bus/ []  Follow-up with PCP Additional Notes: Pt complaining for SOB for a couple of days. Pt has been around sick grandkids  who were flu positive. Pt has had fever, productive cough that has been yellow in color and has a metal taste that is present. Pt said the SOB is minimal but feels her ' chest is heavy." Pt lives in "small 800 sq ft appt and gets SOB walking from area to area." Pt has zero appetite. Pt denies any chest pain or gasping for air.  Per protocol, pt to seen today. Pt has a 1530 virtual appt with Dr Drue Novel today. RN gave care advice and pt verbalized understanding.            Copied from CRM [id number]. Topic: Clinical - Red Word Triage >> Apr 06, 2023 10:54 AM Larwance Sachs wrote: Red Word that prompted transfer to Nurse Triage: Patient called in regarding being sick, states symptoms are trouble breathing and fever reading at 102 over the weekend Reason for Disposition  [1] MILD difficulty breathing (e.g., minimal/no SOB at rest, SOB with walking, pulse <100) AND [2] NEW-onset or WORSE than normal  Answer Assessment - Initial Assessment Questions 1. RESPIRATORY STATUS: "Describe your breathing?" (e.g., wheezing, shortness of breath, unable to speak, severe coughing)      Walking short distance causes SOB 2. ONSET: "When did this breathing problem begin?"      Thurs/Friday 3. PATTERN "Does the difficult breathing come and go, or has it been constant since it started?"      Constant  4. SEVERITY: "How bad is your breathing?" (e.g., mild, moderate, severe)    - MILD: No SOB at rest, mild SOB with walking, speaks normally in sentences, can lie down, no retractions, pulse < 100.    -  MODERATE: SOB at rest, SOB with minimal exertion and prefers to sit, cannot lie down flat, speaks in phrases, mild retractions, audible wheezing, pulse 100-120.    - SEVERE: Very SOB at rest, speaks in single words, struggling to breathe, sitting hunched forward, retractions, pulse > 120      Mild  5. RECURRENT SYMPTOM: "Have you had difficulty breathing before?" If Yes, ask: "When was the last time?" and "What happened that time?"      Years ago when I had bronchitis  6. CARDIAC HISTORY: "Do you have any history of heart disease?" (e.g., heart attack, angina, bypass surgery, angioplasty)      Denies  7. LUNG HISTORY: "Do you have any history of lung disease?"  (e.g., pulmonary embolus, asthma, emphysema)     Denies  8. CAUSE: "What do you think is causing the breathing problem?"      Been around lots of sick grandkids  9. OTHER SYMPTOMS: "Do you have any other symptoms? (e.g., dizziness, runny nose, cough, chest pain, fever)     Cough, fever, dizzy, runny nose  10. O2 SATURATION MONITOR:  "Do you use an oxygen saturation monitor (pulse oximeter) at home?" If Yes, ask: "What is your reading (oxygen level) today?" "What is your usual oxygen saturation reading?" (e.g., 95%)       na  Protocols used: Breathing Difficulty-A-AH

## 2023-04-07 ENCOUNTER — Other Ambulatory Visit (HOSPITAL_COMMUNITY): Payer: Self-pay

## 2023-04-29 ENCOUNTER — Other Ambulatory Visit (HOSPITAL_BASED_OUTPATIENT_CLINIC_OR_DEPARTMENT_OTHER): Payer: Self-pay

## 2023-04-29 ENCOUNTER — Ambulatory Visit: Payer: BC Managed Care – PPO | Admitting: Family

## 2023-04-29 ENCOUNTER — Telehealth: Payer: Self-pay | Admitting: Family

## 2023-04-29 VITALS — BP 102/61 | HR 93 | Temp 98.6°F | Resp 16 | Ht 61.0 in | Wt 199.0 lb

## 2023-04-29 DIAGNOSIS — R221 Localized swelling, mass and lump, neck: Secondary | ICD-10-CM | POA: Diagnosis not present

## 2023-04-29 DIAGNOSIS — E785 Hyperlipidemia, unspecified: Secondary | ICD-10-CM

## 2023-04-29 DIAGNOSIS — R0609 Other forms of dyspnea: Secondary | ICD-10-CM | POA: Diagnosis not present

## 2023-04-29 DIAGNOSIS — R202 Paresthesia of skin: Secondary | ICD-10-CM | POA: Diagnosis not present

## 2023-04-29 DIAGNOSIS — F32A Depression, unspecified: Secondary | ICD-10-CM | POA: Insufficient documentation

## 2023-04-29 DIAGNOSIS — R739 Hyperglycemia, unspecified: Secondary | ICD-10-CM | POA: Diagnosis not present

## 2023-04-29 DIAGNOSIS — B370 Candidal stomatitis: Secondary | ICD-10-CM | POA: Insufficient documentation

## 2023-04-29 DIAGNOSIS — Z1231 Encounter for screening mammogram for malignant neoplasm of breast: Secondary | ICD-10-CM

## 2023-04-29 LAB — CBC WITH DIFFERENTIAL/PLATELET
Basophils Absolute: 0 10*3/uL (ref 0.0–0.1)
Basophils Relative: 0.6 % (ref 0.0–3.0)
Eosinophils Absolute: 0.1 10*3/uL (ref 0.0–0.7)
Eosinophils Relative: 1.6 % (ref 0.0–5.0)
HCT: 42.5 % (ref 36.0–46.0)
Hemoglobin: 14.1 g/dL (ref 12.0–15.0)
Lymphocytes Relative: 30.7 % (ref 12.0–46.0)
Lymphs Abs: 2.6 10*3/uL (ref 0.7–4.0)
MCHC: 33.2 g/dL (ref 30.0–36.0)
MCV: 90 fl (ref 78.0–100.0)
Monocytes Absolute: 0.4 10*3/uL (ref 0.1–1.0)
Monocytes Relative: 5.4 % (ref 3.0–12.0)
Neutro Abs: 5.2 10*3/uL (ref 1.4–7.7)
Neutrophils Relative %: 61.7 % (ref 43.0–77.0)
Platelets: 334 10*3/uL (ref 150.0–400.0)
RBC: 4.72 Mil/uL (ref 3.87–5.11)
RDW: 14.7 % (ref 11.5–15.5)
WBC: 8.4 10*3/uL (ref 4.0–10.5)

## 2023-04-29 LAB — COMPREHENSIVE METABOLIC PANEL
ALT: 69 U/L — ABNORMAL HIGH (ref 0–35)
AST: 29 U/L (ref 0–37)
Albumin: 4.5 g/dL (ref 3.5–5.2)
Alkaline Phosphatase: 94 U/L (ref 39–117)
BUN: 14 mg/dL (ref 6–23)
CO2: 29 meq/L (ref 19–32)
Calcium: 10 mg/dL (ref 8.4–10.5)
Chloride: 102 meq/L (ref 96–112)
Creatinine, Ser: 0.81 mg/dL (ref 0.40–1.20)
GFR: 80.03 mL/min (ref >60.00)
Glucose, Bld: 107 mg/dL — ABNORMAL HIGH (ref 70–99)
Potassium: 4.1 meq/L (ref 3.5–5.1)
Sodium: 141 meq/L (ref 135–145)
Total Bilirubin: 0.4 mg/dL (ref 0.2–1.2)
Total Protein: 7.5 g/dL (ref 6.0–8.3)

## 2023-04-29 LAB — LIPID PANEL
Cholesterol: 194 mg/dL (ref 0–200)
HDL: 41.4 mg/dL (ref >39.00)
LDL Cholesterol: 95 mg/dL (ref 0–99)
NonHDL: 152.52
Total CHOL/HDL Ratio: 5
Triglycerides: 289 mg/dL — ABNORMAL HIGH (ref 0.0–149.0)
VLDL: 57.8 mg/dL — ABNORMAL HIGH (ref 0.0–40.0)

## 2023-04-29 LAB — B12 AND FOLATE PANEL
Folate: 13.9 ng/mL (ref >5.9)
Vitamin B-12: 170 pg/mL — ABNORMAL LOW (ref 211–911)

## 2023-04-29 LAB — T4, FREE: Free T4: 0.92 ng/dL (ref 0.60–1.60)

## 2023-04-29 LAB — T3, FREE: T3, Free: 4.1 pg/mL (ref 2.3–4.2)

## 2023-04-29 LAB — HEMOGLOBIN A1C: Hgb A1c MFr Bld: 6.1 % (ref 4.6–6.5)

## 2023-04-29 LAB — TSH: TSH: 2.51 u[IU]/mL (ref 0.35–5.50)

## 2023-04-29 MED ORDER — NYSTATIN 100000 UNIT/ML MT SUSP
5.0000 mL | Freq: Four times a day (QID) | OROMUCOSAL | 0 refills | Status: DC
  Filled 2023-04-29: qty 70, 3d supply, fill #0
  Filled 2023-04-29: qty 90, 5d supply, fill #0

## 2023-04-29 MED ORDER — BUPROPION HCL ER (XL) 150 MG PO TB24
150.0000 mg | ORAL_TABLET | Freq: Every day | ORAL | 1 refills | Status: DC
  Filled 2023-04-29: qty 30, 30d supply, fill #0

## 2023-04-29 NOTE — Assessment & Plan Note (Signed)
 Need to rule out cardiac etiology.  Obtain echo, refer to cardiology.   EKG tracing is personally reviewed.  EKG notes NSR.  Nonspecific T wave changes V1-V3.

## 2023-04-29 NOTE — Assessment & Plan Note (Signed)
 New. Likely related to recent antibiotic use. Rx with nystatin suspension.

## 2023-04-29 NOTE — Assessment & Plan Note (Signed)
 Concerning for thyroid enlargement.  Will obtain TFT's and thyroid US for further evaluation.

## 2023-04-29 NOTE — Progress Notes (Signed)
 Subjective:     Patient ID: Jessica Kemp, female    DOB: 1965/03/25, 58 y.o.   MRN: [id number]  Chief Complaint  Patient presents with   Hyperlipidemia    Here for follow up   Mass    Patient complains of lump on right side of neck    Hyperlipidemia    Discussed the use of AI scribe software for clinical note transcription with the patient, who gave verbal consent to proceed.  History of Present Illness   The patient presents with a lump on the right side of the neck and difficulty swallowing.  They have experienced a swollen gland on the right side of their neck for a few months, which fluctuates in size. Recently, they have noticed increased puffiness on the right side of their face and difficulty swallowing, describing a sensation of something pressing on their esophagus. They also experience a feeling of heaviness in their head, as if something is sitting on it, along with dizziness and shortness of breath. No chest pain is reported, but there is a sensation of something pressing against their esophagus.  They had the flu a couple of weeks ago, which lasted for three weeks, and were prescribed antibiotics. They have not been taking their cholesterol medication, Lipitor, for about a month due to the flu but have resumed it recently.  They report significant weight gain and express concern about their heart health and cholesterol levels. They also mention a history of neck surgery a few years ago and have not followed up on a carpal tunnel diagnosis they was given.  They report itching and soreness on their tongue, which they attribute to thrush, possibly from antibiotic use. They have been using cortisone cream for itching on their feet without relief. They also mention red and swollen joints in their hands, which they suspect might be related to gout, as they have a family history of the condition.  They have been sleeping excessively on weekends since their dog passed away  eight months ago and are considering medication to help with this.    Health Maintenance Due  Topic Date Due   HIV Screening  Never done   Hepatitis C Screening  Never done   DTaP/Tdap/Td (1 - Tdap) Never done   MAMMOGRAM  Never done   Zoster Vaccines- Shingrix (2 of 2) 12/26/2020   COVID-19 Vaccine (4 - 2024-25 season) 11/02/2022   Colonoscopy  04/14/2023    Past Medical History:  Diagnosis Date   Anxiety    Arthritis    neck, hands   Blood in the stool    Depression    GERD (gastroesophageal reflux disease)    Hypothyroidism    25 years ago   Incontinence in female 2012    Past Surgical History:  Procedure Laterality Date   ANTERIOR CERVICAL DECOMP/DISCECTOMY FUSION  08/2020   C4-5 C5-6 C6-7 ACD   CARPAL TUNNEL RELEASE Left 07/11/2020   Procedure: LEFT CARPAL TUNNEL RELEASE;  Surgeon: Tarry Kos, MD;  Location: [facility];  Service: Orthopedics;  Laterality: Left;   CESAREAN SECTION  1995   PARTIAL HYSTERECTOMY  2012    Family History  Problem Relation Age of Onset   Leukemia Mother    Diabetes Mother    Hyperlipidemia Mother    Stroke Mother    Arthritis Father    Diabetes Father    Hypertension Father    Stroke Father    Alcohol abuse Brother  Arthritis Brother    Hypertension Brother    Heart disease Maternal Grandmother    Heart attack Maternal Grandmother    Heart attack Maternal Grandfather    Cancer Paternal Grandmother    Leukemia Paternal Grandfather    Lymphoma Son    Colon cancer Neg Hx    Esophageal cancer Neg Hx    Stomach cancer Neg Hx    Rectal cancer Neg Hx     Social History   Socioeconomic History   Marital status: Divorced    Spouse name: Not on file   Number of children: 3   Years of education: Not on file   Highest education level: GED or equivalent  Occupational History   Occupation: Advertising account planner  Tobacco Use   Smoking status: Former    Current packs/day: 0.00    Types: Cigarettes    Quit date:  03/03/2015    Years since quitting: 8.1   Smokeless tobacco: Never  Vaping Use   Vaping status: Former   Start date: 03/04/2015   Quit date: 04/04/2015  Substance and Sexual Activity   Alcohol use: No   Drug use: Yes    Types: Marijuana    Comment: last smoked 5-73mos ago   Sexual activity: Not Currently    Birth control/protection: Surgical  Other Topics Concern   Not on file  Social History Narrative   Lives with her dog   2 sons   1 daughter   6 grandchildren (one son local) 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    Social Drivers of Health   Financial Resource Strain: Medium Risk (04/06/2023)   Overall Financial Resource Strain (CARDIA)    Difficulty of Paying Living Expenses: Somewhat hard  Food Insecurity: Food Insecurity Present (04/06/2023)   Hunger Vital Sign    Worried About Running Out of Food in the Last Year: Sometimes true    Ran Out of Food in the Last Year: Patient declined  Transportation Needs: No Transportation Needs (04/06/2023)   PRAPARE - Administrator, Civil Service (Medical): No    Lack of Transportation (Non-Medical): No  Physical Activity: Inactive (04/06/2023)   Exercise Vital Sign    Days of Exercise per Week: 1 day    Minutes of Exercise per Session: 0 min  Stress: Not on file  Social Connections: Unknown (04/06/2023)   Social Connection and Isolation Panel [NHANES]    Frequency of Communication with Friends and Family: More than three times a week    Frequency of Social Gatherings with Friends and Family: Patient declined    Attends Religious Services: Patient declined    Database administrator or Organizations: Patient declined    Attends Engineer, structural: Not on file    Marital Status: Divorced  Intimate Partner Violence: Not on file    Outpatient Medications Prior to Visit  Medication Sig Dispense Refill   atorvastatin (LIPITOR) 40 MG tablet Take 1 tablet (40 mg  total) by mouth daily. 30 tablet 0   azithromycin (ZITHROMAX Z-PAK) 250 MG tablet 2 tabs a day the first day, then 1 tab a day x 4 days 6 tablet 0   gabapentin (NEURONTIN) 100 MG capsule Take 1 capsule (100 mg total) by mouth 3 (three) times daily. Need appt for further refills. 90 capsule 2   meclizine (ANTIVERT) 25 MG tablet Take 1 tablet (25 mg total) by mouth 3 (three) times daily as needed  for dizziness. 30 tablet 0   No facility-administered medications prior to visit.    No Known Allergies  ROS    See HPI Objective:    Physical Exam Constitutional:      General: She is not in acute distress.    Appearance: Normal appearance. She is well-developed.  HENT:     Head: Normocephalic and atraumatic.     Comments: Oral thrush noted on tongue    Right Ear: External ear normal.     Left Ear: External ear normal.  Eyes:     General: No scleral icterus. Neck:     Comments: Tender fullness at the base of the right anterior neck Cardiovascular:     Rate and Rhythm: Normal rate and regular rhythm.     Heart sounds: Normal heart sounds. No murmur heard. Pulmonary:     Effort: Pulmonary effort is normal. No respiratory distress.     Breath sounds: Normal breath sounds. No wheezing.  Musculoskeletal:     Cervical back: Neck supple.  Skin:    General: Skin is warm and dry.  Neurological:     Mental Status: She is alert and oriented to person, place, and time.  Psychiatric:        Mood and Affect: Mood normal.        Behavior: Behavior normal.        Thought Content: Thought content normal.        Judgment: Judgment normal.      BP 102/61 (BP Location: Right Arm, Patient Position: Sitting, Cuff Size: Normal)   Pulse 93   Temp 98.6 F (37 C) (Oral)   Resp 16   Ht 5\' 1"  (1.549 m)   Wt 199 lb (90.3 kg)   SpO2 97%   BMI 37.60 kg/m  Wt Readings from Last 3 Encounters:  04/29/23 199 lb (90.3 kg)  10/31/20 186 lb (84.4 kg)  07/24/20 188 lb (85.3 kg)       Assessment &  Plan:   Problem List Items Addressed This Visit       Unprioritized   Oral thrush   New. Likely related to recent antibiotic use. Rx with nystatin suspension.      Relevant Medications   nystatin (MYCOSTATIN) 100000 UNIT/ML suspension   Neck mass - Primary   Concerning for thyroid enlargement.  Will obtain TFT's and thyroid US for further evaluation.       Relevant Orders   TSH   T3, free   T4, free   CBC w/Diff   Comp Met (CMET)   US THYROID   Hyperlipidemia   Just restarted atorvastatin last week. Will update lipid panel.       Relevant Orders   Lipid panel   Hyperglycemia   Check A1C.      Relevant Orders   HgB A1c   DOE (dyspnea on exertion)   Need to rule out cardiac etiology.  Obtain echo, refer to cardiology.       Relevant Orders   ECHOCARDIOGRAM COMPLETE   Ambulatory referral to Cardiology   EKG 12-Lead (Completed)   Depression   Uncontrolled. Trial of wellbutrin.       Relevant Medications   buPROPion (WELLBUTRIN XL) 150 MG 24 hr tablet   Other Visit Diagnoses       Paresthesia       Relevant Orders   B12 and Folate Panel     Breast cancer screening by mammogram       Relevant Orders  MM 3D SCREENING MAMMOGRAM BILATERAL BREAST       I have discontinued Jessica Kemp "Deb"'s gabapentin, meclizine, and azithromycin. I am also having her start on nystatin and buPROPion. Additionally, I am having her maintain her atorvastatin.  Meds ordered this encounter  Medications   nystatin (MYCOSTATIN) 100000 UNIT/ML suspension    Sig: Take 5 mLs (500,000 Units total) by mouth 4 (four) times daily.    Dispense:  200 mL    Refill:  0    Supervising Provider:   Danise Edge A [[id number]]   buPROPion (WELLBUTRIN XL) 150 MG 24 hr tablet    Sig: Take 1 tablet (150 mg total) by mouth daily.    Dispense:  30 tablet    Refill:  1    Supervising Provider:   Danise Edge A [[id number]]

## 2023-04-29 NOTE — Assessment & Plan Note (Signed)
 Just restarted atorvastatin last week. Will update lipid panel.

## 2023-04-29 NOTE — Assessment & Plan Note (Signed)
 Check A1C

## 2023-04-29 NOTE — Telephone Encounter (Signed)
 B12 is low and is the likely cause for her finger and foot discomfort.  Begin b12 1000 mcg IM weekly x 4 then monthly.  Sugar is in the borderline diabetes range.  Please work on reducing carbs/sugars in her diet, exercise and weight loss.  One of the liver tests is elevated.  I would like to repeat this when she returns next month.   Thyroid testing and blood count are normal.

## 2023-04-29 NOTE — Patient Instructions (Signed)
 VISIT SUMMARY:  During your visit, we discussed several health concerns including a lump on your neck, difficulty swallowing, shortness of breath, dizziness, weight gain, possible diabetes, high cholesterol, oral thrush, possible gout, and increased sleep. We have ordered several tests to better understand these issues and have made some changes to your treatment plan.  YOUR PLAN:  -NECK LUMP AND DIFFICULTY SWALLOWING: We suspect this might be related to your thyroid or lymph nodes. We have ordered an ultrasound of your neck and blood tests to check your thyroid function.  -SHORTNESS OF BREATH AND DIZZINESS: These symptoms could be related to your heart. We have ordered an EKG, a test that checks how your heart is functioning.  -WEIGHT GAIN: We discussed the possibility of starting a new medication, Wegovy, to help with weight loss. This will depend on whether you have diabetes from an insurance standpoint.  -POSSIBLE DIABETES: Given your symptoms and family history, we have ordered a blood test to check for diabetes.  -HIGH CHOLESTEROL: You mentioned that you had stopped taking your cholesterol medication, Lipitor, for a month. We have ordered a cholesterol panel to check your current levels.  -ORAL THRUSH: Your itchy, sore tongue might be due to a fungal infection. We have prescribed an antifungal mouthwash to treat this.  -DEPRESSION- We will begin wellbutrin once daily to see it this helps with your energy and mood.

## 2023-04-29 NOTE — Assessment & Plan Note (Signed)
 Uncontrolled. Trial of wellbutrin.

## 2023-04-30 ENCOUNTER — Encounter: Payer: Self-pay | Admitting: Family

## 2023-04-30 MED ORDER — OMEPRAZOLE 40 MG PO CPDR
40.0000 mg | DELAYED_RELEASE_CAPSULE | Freq: Every day | ORAL | 1 refills | Status: DC
Start: 2023-04-30 — End: 2023-10-03
  Filled 2023-04-30: qty 90, 90d supply, fill #0
  Filled 2023-07-30: qty 90, 90d supply, fill #1

## 2023-04-30 NOTE — Telephone Encounter (Signed)
 Patient notified of results, privider's comments and scheduled to come in for b12 injections.

## 2023-05-01 ENCOUNTER — Other Ambulatory Visit (HOSPITAL_COMMUNITY): Payer: Self-pay

## 2023-05-01 ENCOUNTER — Other Ambulatory Visit: Payer: Self-pay

## 2023-05-04 ENCOUNTER — Other Ambulatory Visit (HOSPITAL_COMMUNITY): Payer: Self-pay

## 2023-05-04 ENCOUNTER — Ambulatory Visit (HOSPITAL_BASED_OUTPATIENT_CLINIC_OR_DEPARTMENT_OTHER)
Admission: RE | Admit: 2023-05-04 | Discharge: 2023-05-04 | Disposition: A | Discharge status: Home / Self Care | Payer: BC Managed Care – PPO | Source: Ambulatory Visit | Attending: Family | Admitting: Family

## 2023-05-04 ENCOUNTER — Encounter: Payer: Self-pay | Admitting: Family

## 2023-05-04 DIAGNOSIS — R221 Localized swelling, mass and lump, neck: Secondary | ICD-10-CM | POA: Insufficient documentation

## 2023-05-05 ENCOUNTER — Encounter: Payer: Self-pay | Admitting: Family

## 2023-05-05 ENCOUNTER — Telehealth (HOSPITAL_BASED_OUTPATIENT_CLINIC_OR_DEPARTMENT_OTHER): Payer: Self-pay

## 2023-05-05 DIAGNOSIS — R09A2 Foreign body sensation, throat: Secondary | ICD-10-CM

## 2023-05-05 DIAGNOSIS — R0609 Other forms of dyspnea: Secondary | ICD-10-CM

## 2023-05-05 NOTE — Addendum Note (Signed)
 Addended by: Sandford Craze on: 05/05/2023 06:25 PM   Modules accepted: Orders

## 2023-05-05 NOTE — Addendum Note (Signed)
 Addended by: Sandford Craze on: 05/05/2023 12:35 PM   Modules accepted: Orders

## 2023-05-06 ENCOUNTER — Telehealth: Payer: Self-pay | Admitting: Family

## 2023-05-06 NOTE — Telephone Encounter (Signed)
 Copied from CRM [id number]. Topic: Referral - Question >> May 06, 2023  3:43 PM [place] J wrote: Reason for CRM: Marchelle Folks from [facility], calling in regards to patient's recent Echo referral. Unfortunately, the point of contact she has on file Catha Brow) is not showing on teams, and so she cannot continue with pre-certification. Marchelle Folks is requesting for information on who she can contact regarding this so the patient can keep their upcoming appointment for their Echo.

## 2023-05-08 ENCOUNTER — Ambulatory Visit (INDEPENDENT_AMBULATORY_CARE_PROVIDER_SITE_OTHER): Payer: BC Managed Care – PPO | Admitting: Emergency Medicine

## 2023-05-08 DIAGNOSIS — E538 Deficiency of other specified B group vitamins: Secondary | ICD-10-CM | POA: Diagnosis not present

## 2023-05-08 MED ORDER — CYANOCOBALAMIN 1000 MCG/ML IJ SOLN
1000.0000 ug | Freq: Once | INTRAMUSCULAR | Status: AC
  Administered 2023-05-08: 1000 ug via INTRAMUSCULAR

## 2023-05-08 NOTE — Progress Notes (Signed)
 Patient here for monthly b12 injection per physicians order.  Injection given in left deltoid and patient tolerated well.

## 2023-05-10 ENCOUNTER — Encounter (HOSPITAL_BASED_OUTPATIENT_CLINIC_OR_DEPARTMENT_OTHER): Admitting: Radiology

## 2023-05-10 ENCOUNTER — Encounter (HOSPITAL_BASED_OUTPATIENT_CLINIC_OR_DEPARTMENT_OTHER): Payer: Self-pay

## 2023-05-15 ENCOUNTER — Ambulatory Visit (INDEPENDENT_AMBULATORY_CARE_PROVIDER_SITE_OTHER): Payer: BC Managed Care – PPO

## 2023-05-15 DIAGNOSIS — E538 Deficiency of other specified B group vitamins: Secondary | ICD-10-CM | POA: Diagnosis not present

## 2023-05-15 MED ORDER — CYANOCOBALAMIN 1000 MCG/ML IJ SOLN
1000.0000 ug | Freq: Once | INTRAMUSCULAR | Status: AC
  Administered 2023-05-15: 1000 ug via INTRAMUSCULAR

## 2023-05-15 NOTE — Progress Notes (Signed)
 Jessica Kemp is a 57 y.o. female presents to the office today for 2/4 weekly B12 injection, per physician's orders. Original order: 04/29/23:  B12 is low and is the likely cause for her finger and foot discomfort.  Begin b12 1000 mcg IM weekly x 4 then monthly.    Cyanocobalamin 1000 mg/ml IM was administered L Deltoid today. Patient tolerated injection. Patient due for follow up labs/provider appt: Yes. Date due: 05/22/23, appt made: PENDING Patient next injection due: 1 week for 3/4 weekly B12, appt made: Will get at 05/22/23 appt.    DOD: Alric Seton, Feliberto Harts

## 2023-05-20 ENCOUNTER — Other Ambulatory Visit: Payer: Self-pay

## 2023-05-20 ENCOUNTER — Other Ambulatory Visit (HOSPITAL_COMMUNITY): Payer: Self-pay

## 2023-05-20 ENCOUNTER — Other Ambulatory Visit: Payer: Self-pay | Admitting: Family

## 2023-05-20 MED ORDER — ATORVASTATIN CALCIUM 40 MG PO TABS
40.0000 mg | ORAL_TABLET | Freq: Every day | ORAL | 0 refills | Status: DC
  Filled 2023-05-20: qty 90, 90d supply, fill #0

## 2023-05-22 ENCOUNTER — Ambulatory Visit: Payer: BC Managed Care – PPO | Admitting: Family

## 2023-05-22 ENCOUNTER — Other Ambulatory Visit: Payer: Self-pay

## 2023-05-22 ENCOUNTER — Other Ambulatory Visit (HOSPITAL_COMMUNITY): Payer: Self-pay

## 2023-05-22 VITALS — BP 122/54 | HR 71 | Temp 98.4°F | Resp 16 | Ht 61.0 in | Wt 194.0 lb

## 2023-05-22 DIAGNOSIS — B370 Candidal stomatitis: Secondary | ICD-10-CM

## 2023-05-22 DIAGNOSIS — R739 Hyperglycemia, unspecified: Secondary | ICD-10-CM | POA: Diagnosis not present

## 2023-05-22 DIAGNOSIS — F418 Other specified anxiety disorders: Secondary | ICD-10-CM | POA: Diagnosis not present

## 2023-05-22 DIAGNOSIS — E785 Hyperlipidemia, unspecified: Secondary | ICD-10-CM

## 2023-05-22 DIAGNOSIS — E538 Deficiency of other specified B group vitamins: Secondary | ICD-10-CM | POA: Diagnosis not present

## 2023-05-22 DIAGNOSIS — K219 Gastro-esophageal reflux disease without esophagitis: Secondary | ICD-10-CM | POA: Insufficient documentation

## 2023-05-22 DIAGNOSIS — R221 Localized swelling, mass and lump, neck: Secondary | ICD-10-CM

## 2023-05-22 MED ORDER — CYANOCOBALAMIN 1000 MCG/ML IJ SOLN
1000.0000 ug | Freq: Once | INTRAMUSCULAR | Status: AC
  Administered 2023-05-22: 1000 ug via INTRAMUSCULAR

## 2023-05-22 MED ORDER — BUPROPION HCL ER (XL) 150 MG PO TB24
150.0000 mg | ORAL_TABLET | Freq: Every day | ORAL | 1 refills | Status: DC
  Filled 2023-05-22: qty 90, 90d supply, fill #0
  Filled 2023-07-30: qty 90, 90d supply, fill #1

## 2023-05-22 NOTE — Progress Notes (Signed)
 +++++++++++++++++++++++++++++++++++++++++++++++++++++++++++++++++++++++++++++++++++++++++++++++++++++++++++++++++++++++++++++++++++++++++++++++++++++++++++++++++++++++++++++++++++++++++++++++++++++++++++++++++++++++++++++++-  Subjective:     Patient ID: Jessica Kemp, female    DOB: 07-02-1965, 58 y.o.   MRN: [id number]  Chief Complaint  Patient presents with   Depression    Her for follow up after starting wellbutrin    Depression         Discussed the use of AI scribe software for clinical note transcription with the patient, who gave verbal consent to proceed.  History of Present Illness  Jessica Kemp "[name]" is a 58 year old female who presents for follow-up of multiple medical issues including lymphadenopathy, B12 deficiency, and medication management.  She has a persistent swollen lymph node on the right side of her neck, more noticeable in the morning. An ultrasound previously showed no abnormalities. No dental issues on the affected side and she typically does not experience swollen lymph nodes, even when ill. No tenderness in the swollen lymph node area.  She has experienced significant improvement in mood, energy levels, and appetite since starting Wellbutrin. She has not consumed sugar in two and a half weeks and has lost four pounds. Cravings have decreased, which she attributes to the medication. She is satisfied with the effects of Wellbutrin.  She has a history of B12 deficiency and is currently receiving B12 injections. There is improvement in tingling sensations in her fingers and toes since starting the injections. She is in her third week of weekly B12 shots and plans to transition to monthly injections.  She recently restarted atorvastatin for hyperlipidemia. Her triglycerides remain elevated, but she is working on dietary changes, particularly reducing white carbs and sugars. She is motivated to improve her diet and has been eating healthier options.  She has a  history of oral thrush, which was treated with nystatin and has resolved. She also experienced difficulty swallowing, attributed to reflux, which has improved with omeprazole. No current issues with swallowing.  Her grandmother lived until 24 years old and had insulin-dependent diabetes for 40 years, developing the condition at age 46 along with hypothyroidism. Her grandmother's daughter experienced similar health issues at the same age.  She has five grandchildren and often has sweets in her house for them. She has been able to resist these sweets since starting Wellbutrin.     Health Maintenance Due  Topic Date Due   HIV Screening  Never done   Hepatitis C Screening  Never done   DTaP/Tdap/Td (1 - Tdap) Never done   MAMMOGRAM  Never done   Zoster Vaccines- Shingrix (2 of 2) 12/26/2020   COVID-19 Vaccine (4 - 2024-25 season) 11/02/2022   Colonoscopy  04/14/2023    Past Medical History:  Diagnosis Date   Anxiety    Arthritis    neck, hands   Blood in the stool    Depression    GERD (gastroesophageal reflux disease)    Hypothyroidism    25 years ago   Incontinence in female 2012    Past Surgical History:  Procedure Laterality Date   ANTERIOR CERVICAL DECOMP/DISCECTOMY FUSION  08/2020   C4-5 C5-6 C6-7 ACD   CARPAL TUNNEL RELEASE Left 07/11/2020   Procedure: LEFT CARPAL TUNNEL RELEASE;  Surgeon: Tarry Kos, MD;  Location: [facility];  Service: Orthopedics;  Laterality: Left;   CESAREAN SECTION  1995   PARTIAL HYSTERECTOMY  2012    Family History  Problem Relation Age of Onset   Leukemia Mother    Diabetes Mother    Hyperlipidemia Mother    Stroke  Mother    Arthritis Father    Diabetes Father    Hypertension Father    Stroke Father    Alcohol abuse Brother    Arthritis Brother    Hypertension Brother    Heart disease Maternal Grandmother    Heart attack Maternal Grandmother    Heart attack Maternal Grandfather    Cancer Paternal Grandmother     Leukemia Paternal Grandfather    Lymphoma Son    Colon cancer Neg Hx    Esophageal cancer Neg Hx    Stomach cancer Neg Hx    Rectal cancer Neg Hx     Social History   Socioeconomic History   Marital status: Divorced    Spouse name: Not on file   Number of children: 3   Years of education: Not on file   Highest education level: GED or equivalent  Occupational History   Occupation: Advertising account planner  Tobacco Use   Smoking status: Former    Current packs/day: 0.00    Types: Cigarettes    Quit date: 03/03/2015    Years since quitting: 8.2   Smokeless tobacco: Never  Vaping Use   Vaping status: Former   Start date: 03/04/2015   Quit date: 04/04/2015  Substance and Sexual Activity   Alcohol use: No   Drug use: Yes    Types: Marijuana    Comment: last smoked 5-31mos ago   Sexual activity: Not Currently    Birth control/protection: Surgical  Other Topics Concern   Not on file  Social History Narrative   Lives with her dog   2 sons   1 daughter   6 grandchildren (one son local) Research scientist (life sciences) in [place]   Works in Community education officer    Enjoys spending time with her grandchildren.    Social Drivers of Health   Financial Resource Strain: Medium Risk (04/06/2023)   Overall Financial Resource Strain (CARDIA)    Difficulty of Paying Living Expenses: Somewhat hard  Food Insecurity: Food Insecurity Present (04/06/2023)   Hunger Vital Sign    Worried About Running Out of Food in the Last Year: Sometimes true    Ran Out of Food in the Last Year: Patient declined  Transportation Needs: No Transportation Needs (04/06/2023)   PRAPARE - Administrator, Civil Service (Medical): No    Lack of Transportation (Non-Medical): No  Physical Activity: Inactive (04/06/2023)   Exercise Vital Sign    Days of Exercise per Week: 1 day    Minutes of Exercise per Session: 0 min  Stress: Not on file  Social Connections: Unknown (04/06/2023)   Social Connection and Isolation Panel [NHANES]    Frequency of  Communication with Friends and Family: More than three times a week    Frequency of Social Gatherings with Friends and Family: Patient declined    Attends Religious Services: Patient declined    Database administrator or Organizations: Patient declined    Attends Engineer, structural: Not on file    Marital Status: Divorced  Intimate Partner Violence: Not on file    Outpatient Medications Prior to Visit  Medication Sig Dispense Refill   atorvastatin (LIPITOR) 40 MG tablet Take 1 tablet (40 mg total) by mouth daily. 90 tablet 0   nystatin (MYCOSTATIN) 100000 UNIT/ML suspension Take 5 mLs (500,000 Units total) by mouth 4 (four) times daily. 200 mL 0   omeprazole (PRILOSEC) 40 MG capsule Take 1 capsule (40 mg total) by mouth daily. 90 capsule 1   buPROPion (  WELLBUTRIN XL) 150 MG 24 hr tablet Take 1 tablet (150 mg total) by mouth daily. 30 tablet 1   No facility-administered medications prior to visit.    No Known Allergies  Review of Systems  Psychiatric/Behavioral:  Positive for depression.        Objective:    Physical Exam Constitutional:      General: She is not in acute distress.    Appearance: Normal appearance. She is well-developed.  HENT:     Head: Normocephalic and atraumatic.     Right Ear: External ear normal.     Left Ear: External ear normal.  Eyes:     General: No scleral icterus. Neck:     Thyroid: No thyromegaly.     Comments: Palpable lymph node non-tender base of right neck, not significantly enlarged Small palpable  Cardiovascular:     Rate and Rhythm: Normal rate and regular rhythm.     Heart sounds: Normal heart sounds. No murmur heard. Pulmonary:     Effort: Pulmonary effort is normal. No respiratory distress.     Breath sounds: Normal breath sounds. No wheezing.  Musculoskeletal:     Cervical back: Neck supple.  Skin:    General: Skin is warm and dry.  Neurological:     Mental Status: She is alert and oriented to person, place, and  time.  Psychiatric:        Mood and Affect: Mood normal.        Behavior: Behavior normal.        Thought Content: Thought content normal.        Judgment: Judgment normal.      BP (!) 122/54 (BP Location: Right Arm, Patient Position: Sitting, Cuff Size: Normal)   Pulse 71   Temp 98.4 F (36.9 C) (Oral)   Resp 16   Ht 5\' 1"  (1.549 m)   Wt 194 lb (88 kg)   SpO2 97%   BMI 36.66 kg/m  Wt Readings from Last 3 Encounters:  05/22/23 194 lb (88 kg)  04/29/23 199 lb (90.3 kg)  10/31/20 186 lb (84.4 kg)       Assessment & Plan:   Problem List Items Addressed This Visit       Unprioritized   Vitamin B 12 deficiency - Primary   New diagnosis, finger paresthesias and tongue discomfort resolved since starting b12 injections.       Relevant Orders   B12   Oral thrush   Resolved.      Neck mass   Normal appearing cervical lymph node on [place].      Hyperlipidemia   Working on diet, continues lipitor.       Relevant Orders   Lipid panel   Hyperglycemia   Lab Results  Component Value Date   HGBA1C 6.1 04/29/2023   Borderline DM.  Continues to work on diet and weight loss.        Relevant Orders   HgB A1c   Comp Met (CMET)   GERD (gastroesophageal reflux disease)   Stable/improved. No longer having a globus sensation. Continue omeprazole.       Depression with anxiety   Notable improvement since starting wellbutrin.  Continue same.      Relevant Medications   buPROPion (WELLBUTRIN XL) 150 MG 24 hr tablet   Other Visit Diagnoses       B12 deficiency           I am having Jessica Kemp "Deb" maintain her nystatin, omeprazole, atorvastatin,  and buPROPion. We administered cyanocobalamin.  Meds ordered this encounter  Medications   cyanocobalamin (VITAMIN B12) injection 1,000 mcg   buPROPion (WELLBUTRIN XL) 150 MG 24 hr tablet    Sig: Take 1 tablet (150 mg total) by mouth daily.    Dispense:  90 tablet    Refill:  1    Would like mail delivery     Supervising Provider:   Danise Edge A [[id number]]

## 2023-05-22 NOTE — Assessment & Plan Note (Signed)
 New diagnosis, finger paresthesias and tongue discomfort resolved since starting b12 injections.

## 2023-05-22 NOTE — Assessment & Plan Note (Signed)
 Normal appearing cervical lymph node on [place].

## 2023-05-22 NOTE — Assessment & Plan Note (Signed)
 Resolved

## 2023-05-22 NOTE — Assessment & Plan Note (Signed)
 Notable improvement since starting wellbutrin.  Continue same.

## 2023-05-22 NOTE — Assessment & Plan Note (Signed)
 Working on diet, continues lipitor.

## 2023-05-22 NOTE — Assessment & Plan Note (Signed)
 Lab Results  Component Value Date   HGBA1C 6.1 04/29/2023   Borderline DM.  Continues to work on diet and weight loss.

## 2023-05-22 NOTE — Patient Instructions (Signed)
 VISIT SUMMARY:  Today, we discussed several of your ongoing health issues, including the swollen lymph node in your neck, B12 deficiency, hyperlipidemia, prediabetes, major depressive disorder, and osteoarthritis of the thumb. We reviewed your current treatments and made some adjustments to your care plan to help manage these conditions more effectively.  YOUR PLAN:  -SWOLLEN LYMPH NODES: You have a persistent swollen lymph node on the right side of your neck. Although an ultrasound showed no abnormalities, we will refer you to an ENT specialist to further evaluate the swelling and any swallowing issues.  -GASTROESOPHAGEAL REFLUX DISEASE (GERD): GERD is a condition where stomach acid frequently flows back into the tube connecting your mouth and stomach. Your difficulty swallowing has improved with omeprazole, so you should continue taking it as prescribed.  -B12 DEFICIENCY: B12 deficiency can lead to anemia and neurological symptoms. Your B12 levels have improved with weekly injections. We will continue these injections weekly for four weeks, then switch to monthly. We will check your B12 levels in three months.  -HYPERLIPIDEMIA: Hyperlipidemia means you have high levels of fats (lipids) in your blood. Your triglycerides are still elevated, so continue taking atorvastatin and make dietary changes to reduce white carbs and sugars.  -PREDIABETES: Prediabetes means your blood sugar levels are higher than normal but not high enough to be classified as diabetes. Continue with your lifestyle modifications to manage this condition.  -MAJOR DEPRESSIVE DISORDER: Major depressive disorder is a mental health condition characterized by persistent feelings of sadness and loss of interest. Your mood, energy, and appetite have improved with Wellbutrin. We will prescribe a 90-day supply with a refill.  -OSTEOARTHRITIS OF THE THUMB: Osteoarthritis is a type of arthritis that occurs when flexible tissue at the ends  of bones wears down. You have been using Voltaren gel for relief, and you should continue using it as needed.  INSTRUCTIONS:  Please follow up with the ENT specialist for your swollen lymph node and swallowing issues. Continue taking your medications as prescribed and adhere to the dietary and lifestyle changes we discussed. We will check your B12 levels in three months before your third monthly injection. Your Wellbutrin prescription has been sent to [facility].

## 2023-05-22 NOTE — Assessment & Plan Note (Signed)
 Stable/improved. No longer having a globus sensation. Continue omeprazole.

## 2023-05-29 ENCOUNTER — Encounter (HOSPITAL_BASED_OUTPATIENT_CLINIC_OR_DEPARTMENT_OTHER): Payer: Self-pay | Admitting: Radiology

## 2023-05-29 ENCOUNTER — Ambulatory Visit (HOSPITAL_BASED_OUTPATIENT_CLINIC_OR_DEPARTMENT_OTHER)
Admission: RE | Admit: 2023-05-29 | Discharge: 2023-05-29 | Disposition: A | Discharge status: Home / Self Care | Source: Ambulatory Visit | Attending: Family | Admitting: Family

## 2023-05-29 ENCOUNTER — Ambulatory Visit (INDEPENDENT_AMBULATORY_CARE_PROVIDER_SITE_OTHER): Admitting: Emergency Medicine

## 2023-05-29 ENCOUNTER — Encounter: Payer: Self-pay | Admitting: Family

## 2023-05-29 ENCOUNTER — Ambulatory Visit (INDEPENDENT_AMBULATORY_CARE_PROVIDER_SITE_OTHER)

## 2023-05-29 DIAGNOSIS — E538 Deficiency of other specified B group vitamins: Secondary | ICD-10-CM

## 2023-05-29 DIAGNOSIS — R0609 Other forms of dyspnea: Secondary | ICD-10-CM | POA: Diagnosis not present

## 2023-05-29 DIAGNOSIS — Z1231 Encounter for screening mammogram for malignant neoplasm of breast: Secondary | ICD-10-CM | POA: Diagnosis not present

## 2023-05-29 LAB — ECHOCARDIOGRAM COMPLETE
Area-P 1/2: 3.48 cm2
S' Lateral: 1.78 cm

## 2023-05-29 MED ORDER — CYANOCOBALAMIN 1000 MCG/ML IJ SOLN
1000.0000 ug | Freq: Once | INTRAMUSCULAR | Status: AC
  Administered 2023-05-29: 1000 ug via INTRAMUSCULAR

## 2023-05-29 NOTE — Progress Notes (Signed)
 Patient here for monthly b12 injection per physicians order.  Injection given in left deltoid and patient tolerated well.

## 2023-07-01 ENCOUNTER — Ambulatory Visit (INDEPENDENT_AMBULATORY_CARE_PROVIDER_SITE_OTHER)

## 2023-07-01 DIAGNOSIS — E538 Deficiency of other specified B group vitamins: Secondary | ICD-10-CM | POA: Diagnosis not present

## 2023-07-01 MED ORDER — CYANOCOBALAMIN 1000 MCG/ML IJ SOLN
1000.0000 ug | Freq: Once | INTRAMUSCULAR | Status: AC
  Administered 2023-07-01: 1000 ug via INTRAMUSCULAR

## 2023-07-01 NOTE — Progress Notes (Signed)
Pt here for monthly B12 injection per Melissa  B12 1050mcg given IM, and pt tolerated injection well.  Next B12 injection scheduled for

## 2023-07-07 ENCOUNTER — Institutional Professional Consult (permissible substitution) (INDEPENDENT_AMBULATORY_CARE_PROVIDER_SITE_OTHER)

## 2023-07-15 ENCOUNTER — Ambulatory Visit: Admitting: Cardiology

## 2023-07-30 ENCOUNTER — Other Ambulatory Visit: Payer: Self-pay | Admitting: Family

## 2023-07-31 ENCOUNTER — Other Ambulatory Visit: Payer: Self-pay

## 2023-07-31 ENCOUNTER — Ambulatory Visit (INDEPENDENT_AMBULATORY_CARE_PROVIDER_SITE_OTHER): Admitting: *Deleted

## 2023-07-31 DIAGNOSIS — E538 Deficiency of other specified B group vitamins: Secondary | ICD-10-CM

## 2023-07-31 MED ORDER — CYANOCOBALAMIN 1000 MCG/ML IJ SOLN
1000.0000 ug | Freq: Once | INTRAMUSCULAR | Status: AC
  Administered 2023-07-31: 1000 ug via INTRAMUSCULAR

## 2023-07-31 MED ORDER — ATORVASTATIN CALCIUM 40 MG PO TABS
40.0000 mg | ORAL_TABLET | Freq: Every day | ORAL | 0 refills | Status: DC
  Filled 2023-07-31: qty 90, 90d supply, fill #0

## 2023-07-31 NOTE — Progress Notes (Signed)
 Patient here for monthly b12 injection per physicians order.  Injection given in left deltoid and patient tolerated well.  Patient will get next injection on 08/25/23 at office visit.

## 2023-08-25 ENCOUNTER — Encounter: Payer: Self-pay | Admitting: Family

## 2023-08-25 ENCOUNTER — Ambulatory Visit: Admitting: Family

## 2023-08-25 VITALS — BP 124/66 | HR 64 | Temp 98.4°F | Resp 16 | Ht 61.0 in | Wt 180.0 lb

## 2023-08-25 DIAGNOSIS — R739 Hyperglycemia, unspecified: Secondary | ICD-10-CM

## 2023-08-25 DIAGNOSIS — F418 Other specified anxiety disorders: Secondary | ICD-10-CM

## 2023-08-25 DIAGNOSIS — K5909 Other constipation: Secondary | ICD-10-CM | POA: Insufficient documentation

## 2023-08-25 DIAGNOSIS — R0681 Apnea, not elsewhere classified: Secondary | ICD-10-CM

## 2023-08-25 DIAGNOSIS — J40 Bronchitis, not specified as acute or chronic: Secondary | ICD-10-CM | POA: Insufficient documentation

## 2023-08-25 DIAGNOSIS — E538 Deficiency of other specified B group vitamins: Secondary | ICD-10-CM

## 2023-08-25 DIAGNOSIS — K219 Gastro-esophageal reflux disease without esophagitis: Secondary | ICD-10-CM

## 2023-08-25 DIAGNOSIS — E785 Hyperlipidemia, unspecified: Secondary | ICD-10-CM

## 2023-08-25 MED ORDER — AZITHROMYCIN 250 MG PO TABS
ORAL_TABLET | ORAL | 0 refills | Status: AC
  Filled 2023-08-25: qty 6, 5d supply, fill #0

## 2023-08-25 NOTE — Assessment & Plan Note (Addendum)
 Lab Results  Component Value Date   CHOL 194 04/29/2023   HDL 41.40 04/29/2023   LDLCALC 95 04/29/2023   LDLDIRECT 183.0 12/06/2021   TRIG 289.0 (H) 04/29/2023   CHOLHDL 5 04/29/2023   Maintained on lipitor 40mg .

## 2023-08-25 NOTE — Assessment & Plan Note (Signed)
 1 month of chest congestion- will rx with azithromycin . Lung exam normal.

## 2023-08-25 NOTE — Progress Notes (Signed)
 Subjective:     Patient ID: Jessica Kemp, female    DOB: [birth year], 58 y.o.   MRN: [id number]  Chief Complaint  Patient presents with   Hyperlipidemia    Here for follow up   B 12 deficiency    Here for follow up    HPI  Discussed the use of AI scribe software for clinical note transcription with the patient, who gave verbal consent to proceed.  History of Present Illness  Jessica Kemp is a 58 year old female with B12 deficiency and lymphadenopathy who presents for a routine follow-up and evaluation of persistent fatigue and swollen lymph nodes.  Fatigue and energy level - Persistent fatigue, worsened since B12 injections were reduced from weekly to monthly - Feels she should have more energy despite significant weight loss - Tiredness persists despite mood improvement with Wellbutrin  - Questions sleep quality and suspects sleep apnea, as her son observed apneic episodes during sleep  Palpable lymph noed - Patient still feels some swelling on the right side of her neck. US  showed small benign appearing lymph node. - Upcoming appointments with ENT and cardiologist  Cough and respiratory symptoms - Congestion for over a month, both nasal and chest - Cough productive of brown sputum - Cough reminiscent of a smoker's cough, though she quit smoking at age 54 - No relief with over-the-counter sinus medications  Gastrointestinal symptoms - Alternating diarrhea and constipation - Change in stool shape - History of intestinal problems - Persistent gastrointestinal issues with concern for underlying conditions - Vomiting and diarrhea after last B12 injection, suspected to be related  Weight loss - Unintentional weight loss of 19 pounds since April after eliminating sugar from diet  Vasomotor symptoms - Continued hot flashes despite hysterectomy in 2012 with ovaries intact     Health Maintenance Due  Topic Date Due   DTaP/Tdap/Td (1 - Tdap) Never done    Hepatitis B Vaccines (1 of 3 - 19+ 3-dose series) Never done   Zoster Vaccines- Shingrix (2 of 2) 12/26/2020   COVID-19 Vaccine (4 - 2024-25 season) 11/02/2022   Colonoscopy  04/14/2023    Past Medical History:  Diagnosis Date   Anxiety    Arthritis    neck, hands   Blood in the stool    Depression    GERD (gastroesophageal reflux disease)    Hypothyroidism    25 years ago   Incontinence in female 2012    Past Surgical History:  Procedure Laterality Date   ANTERIOR CERVICAL DECOMP/DISCECTOMY FUSION  08/2020   C4-5 C5-6 C6-7 ACD   CARPAL TUNNEL RELEASE Left 07/11/2020   Procedure: LEFT CARPAL TUNNEL RELEASE;  Surgeon: Jerri Kay HERO, MD;  Location: [facility];  Service: Orthopedics;  Laterality: Left;   CESAREAN SECTION  1995   PARTIAL HYSTERECTOMY  2012    Family History  Problem Relation Age of Onset   Leukemia Mother    Diabetes Mother    Hyperlipidemia Mother    Stroke Mother    Arthritis Father    Diabetes Father    Hypertension Father    Stroke Father    Alcohol abuse Brother    Arthritis Brother    Hypertension Brother    Heart disease Maternal Grandmother    Heart attack Maternal Grandmother    Heart attack Maternal Grandfather    Cancer Paternal Grandmother    Leukemia Paternal Grandfather    Lymphoma Son    Colon cancer Neg Hx  Esophageal cancer Neg Hx    Stomach cancer Neg Hx    Rectal cancer Neg Hx     Social History   Socioeconomic History   Marital status: Divorced    Spouse name: Not on file   Number of children: 3   Years of education: Not on file   Highest education level: GED or equivalent  Occupational History   Occupation: Advertising account planner  Tobacco Use   Smoking status: Former    Current packs/day: 0.00    Types: Cigarettes    Quit date: 03/03/2015    Years since quitting: 8.4   Smokeless tobacco: Never  Vaping Use   Vaping status: Former   Start date: 03/04/2015   Quit date: 04/04/2015  Substance and Sexual  Activity   Alcohol use: No   Drug use: Yes    Types: Marijuana    Comment: last smoked 5-25mos ago   Sexual activity: Not Currently    Birth control/protection: Surgical  Other Topics Concern   Not on file  Social History Narrative   Lives with her dog   2 sons   1 daughter   6 grandchildren (one son local) Research scientist (life sciences) in [place]   Works in Community education officer    Enjoys spending time with her grandchildren.    Social Drivers of Health   Financial Resource Strain: Patient Declined (08/24/2023)   Overall Financial Resource Strain (CARDIA)    Difficulty of Paying Living Expenses: Patient declined  Food Insecurity: Patient Declined (08/24/2023)   Hunger Vital Sign    Worried About Running Out of Food in the Last Year: Patient declined    Ran Out of Food in the Last Year: Patient declined  Transportation Needs: No Transportation Needs (08/24/2023)   PRAPARE - Administrator, Civil Service (Medical): No    Lack of Transportation (Non-Medical): No  Physical Activity: Insufficiently Active (08/24/2023)   Exercise Vital Sign    Days of Exercise per Week: 1 day    Minutes of Exercise per Session: 10 min  Stress: No Stress Concern Present (08/24/2023)   [employer] of Occupational Health - Occupational Stress Questionnaire    Feeling of Stress: Only a little  Social Connections: Socially Isolated (08/24/2023)   Social Connection and Isolation Panel    Frequency of Communication with Friends and Family: More than three times a week    Frequency of Social Gatherings with Friends and Family: Twice a week    Attends Religious Services: Never    Database administrator or Organizations: No    Attends Engineer, structural: Not on file    Marital Status: Divorced  Catering manager Violence: Not on file    Outpatient Medications Prior to Visit  Medication Sig Dispense Refill   atorvastatin  (LIPITOR) 40 MG tablet Take 1 tablet (40 mg total) by mouth daily. 90 tablet 0    buPROPion  (WELLBUTRIN  XL) 150 MG 24 hr tablet Take 1 tablet (150 mg total) by mouth daily. 90 tablet 1   omeprazole  (PRILOSEC) 40 MG capsule Take 1 capsule (40 mg total) by mouth daily. 90 capsule 1   nystatin  (MYCOSTATIN ) 100000 UNIT/ML suspension Take 5 mLs (500,000 Units total) by mouth 4 (four) times daily. 200 mL 0   No facility-administered medications prior to visit.    No Known Allergies  ROS    See HPI Objective:    Physical Exam Constitutional:      General: She is not in acute distress.    Appearance: Normal  appearance. She is well-developed.  HENT:     Head: Normocephalic and atraumatic.     Right Ear: External ear normal.     Left Ear: External ear normal.   Eyes:     General: No scleral icterus.  Neck:     Thyroid : No thyromegaly.     Comments: Small mobile lymph node approximately 5mm size Cardiovascular:     Rate and Rhythm: Normal rate and regular rhythm.     Heart sounds: Normal heart sounds. No murmur heard. Pulmonary:     Effort: Pulmonary effort is normal. No respiratory distress.     Breath sounds: Normal breath sounds. No wheezing.   Musculoskeletal:     Cervical back: Neck supple.   Skin:    General: Skin is warm and dry.   Neurological:     Mental Status: She is alert and oriented to person, place, and time.   Psychiatric:        Mood and Affect: Mood normal.        Behavior: Behavior normal.        Thought Content: Thought content normal.        Judgment: Judgment normal.      BP 124/66 (BP Location: Right Arm, Patient Position: Sitting, Cuff Size: Normal)   Pulse 64   Temp 98.4 F (36.9 C) (Oral)   Resp 16   Ht 5' 1 (1.549 m)   Wt 180 lb (81.6 kg)   SpO2 98%   BMI 34.01 kg/m  Wt Readings from Last 3 Encounters:  08/25/23 180 lb (81.6 kg)  05/22/23 194 lb (88 kg)  04/29/23 199 lb (90.3 kg)       Assessment & Plan:   Problem List Items Addressed This Visit       Unprioritized   Vitamin B 12 deficiency - Primary    Continues b12 injections. Update level today.      Relevant Orders   B12   Hyperlipidemia   Lab Results  Component Value Date   CHOL 194 04/29/2023   HDL 41.40 04/29/2023   LDLCALC 95 04/29/2023   LDLDIRECT 183.0 12/06/2021   TRIG 289.0 (H) 04/29/2023   CHOLHDL 5 04/29/2023   Maintained on lipitor 40mg .       Relevant Orders   Lipid panel   Hyperglycemia   Lab Results  Component Value Date   HGBA1C 6.1 04/29/2023   Update A1C.      Relevant Orders   Comp Met (CMET)   HgB A1c   GERD (gastroesophageal reflux disease)   Stable on omeprazole .       Depression with anxiety   Stable on Wellbutrin .       Chronic constipation   Relevant Orders   Ambulatory referral to Gastroenterology   Bronchitis   1 month of chest congestion- will rx with azithromycin . Lung exam normal.      Relevant Medications   azithromycin  (ZITHROMAX ) 250 MG tablet   Other Visit Diagnoses       Witnessed episode of apnea       Relevant Orders   Ambulatory referral to Pulmonology       I am having Jessica Kemp start on azithromycin . I am also having her maintain her nystatin , omeprazole , buPROPion , and atorvastatin .  Meds ordered this encounter  Medications   azithromycin  (ZITHROMAX ) 250 MG tablet    Sig: Take 2 tablets on day 1, then 1 tablet daily on days 2 through 5    Dispense:  6  tablet    Refill:  0    Supervising Provider:   DOMENICA BLACKBIRD A [[id number]]

## 2023-08-25 NOTE — Assessment & Plan Note (Signed)
 Stable on Wellbutrin

## 2023-08-25 NOTE — Patient Instructions (Signed)
 VISIT SUMMARY:  During your visit, we discussed your persistent fatigue, swollen lymph nodes, and other symptoms. We reviewed your B12 deficiency, potential sleep apnea, chronic sinus congestion, and gastrointestinal issues. We also addressed your weight loss, menopausal symptoms, and general health maintenance needs.  YOUR PLAN:  VITAMIN B12 DEFICIENCY: Your fatigue may be due to insufficient B12 injection frequency, possibly related to small intestine malabsorption. -We will check your B12 levels with a blood test. -You received a B12 injection today. -If your B12 levels are low, we will teach you how to self-administer B12 injections and may increase the frequency of your injections.  SUSPECTED SLEEP APNEA: Your excessive daytime fatigue and observed apneic episodes suggest you may have sleep apnea. -We will refer you to a pulmonary sleep specialist for evaluation. -A home sleep study has been ordered. -If sleep apnea is confirmed, we will discuss prescribing a CPAP machine.  CHRONIC SINUS CONGESTION: You have nasal and chest congestion with a productive cough, possibly due to an infection. -We prescribed a Z-Pak (azithromycin ) to treat a possible infection. -Continue using Sudafed for sinus congestion. -Consider a dental evaluation to check for any oral sources of your symptoms.  CERVICAL LYMPHADENOPATHY: You have a persistent swollen lymph node on the right side of your neck, which is not enlarged and appears normal. -We will continue to monitor the lymph node and reassure you that it is not concerning. -Follow up with your ENT appointment in August.  GASTROESOPHAGEAL REFLUX DISEASE (GERD): Your reflux symptoms are well-controlled with omeprazole . -Continue taking omeprazole  as prescribed. -Discuss any reflux symptoms with your ENT during your upcoming appointment.  PREDIABETES: Your previous A1c level indicates prediabetes, and your weight loss and sugar avoidance should help  improve your glucose control. -We will order an A1c test to reassess your glucose control.  HYPERLIPIDEMIA: You are on cholesterol medication, and your weight loss is expected to improve your lipid levels. -We will order a lipid panel to assess your cholesterol levels.  MENOPAUSAL SYMPTOMS: You are experiencing ongoing hot flashes  -We will provide reassurance about your menopausal symptoms.  GENERAL HEALTH MAINTENANCE: You are due for a colonoscopy and need to address your gastrointestinal symptoms. -We will refer you to a gastroenterologist for a consultation and potential colonoscopy. -We will discuss the need for an endoscopy if your symptoms indicate it.  FOLLOW-UP: You have multiple follow-up appointments and tests scheduled for your ongoing health concerns. -Follow up with your ENT in August. -Follow up with your cardiologist in August. -Schedule a dental evaluation. -Complete lab work for B12, metabolic panel, A1c, and cholesterol. -Await the results of your sleep study and follow up as needed. -Await the results of your gastroenterology consultation and follow up as needed.

## 2023-08-25 NOTE — Assessment & Plan Note (Signed)
 Stable on omeprazole.

## 2023-08-25 NOTE — Assessment & Plan Note (Signed)
 Continues b12 injections. Update level today.

## 2023-08-25 NOTE — Assessment & Plan Note (Signed)
 Lab Results  Component Value Date   HGBA1C 6.1 04/29/2023   Update A1C.

## 2023-08-26 ENCOUNTER — Telehealth: Payer: Self-pay | Admitting: Family

## 2023-08-26 ENCOUNTER — Other Ambulatory Visit (HOSPITAL_BASED_OUTPATIENT_CLINIC_OR_DEPARTMENT_OTHER): Payer: Self-pay

## 2023-08-26 ENCOUNTER — Encounter (HOSPITAL_COMMUNITY): Payer: Self-pay

## 2023-08-26 ENCOUNTER — Other Ambulatory Visit (HOSPITAL_COMMUNITY): Payer: Self-pay

## 2023-08-26 DIAGNOSIS — E538 Deficiency of other specified B group vitamins: Secondary | ICD-10-CM

## 2023-08-26 LAB — COMPREHENSIVE METABOLIC PANEL WITH GFR
ALT: 21 U/L (ref 0–35)
AST: 18 U/L (ref 0–37)
Albumin: 4.6 g/dL (ref 3.5–5.2)
Alkaline Phosphatase: 92 U/L (ref 39–117)
BUN: 11 mg/dL (ref 6–23)
CO2: 29 meq/L (ref 19–32)
Calcium: 10 mg/dL (ref 8.4–10.5)
Chloride: 104 meq/L (ref 96–112)
Creatinine, Ser: 0.97 mg/dL (ref 0.40–1.20)
GFR: 64.32 mL/min (ref >60.00)
Glucose, Bld: 95 mg/dL (ref 70–99)
Potassium: 5.1 meq/L (ref 3.5–5.1)
Sodium: 142 meq/L (ref 135–145)
Total Bilirubin: 0.5 mg/dL (ref 0.2–1.2)
Total Protein: 7.1 g/dL (ref 6.0–8.3)

## 2023-08-26 LAB — LIPID PANEL
Cholesterol: 186 mg/dL (ref 0–200)
HDL: 35.1 mg/dL — ABNORMAL LOW (ref >39.00)
LDL Cholesterol: 105 mg/dL — ABNORMAL HIGH (ref 0–99)
NonHDL: 151.37
Total CHOL/HDL Ratio: 5
Triglycerides: 233 mg/dL — ABNORMAL HIGH (ref 0.0–149.0)
VLDL: 46.6 mg/dL — ABNORMAL HIGH (ref 0.0–40.0)

## 2023-08-26 LAB — HEMOGLOBIN A1C: Hgb A1c MFr Bld: 6.1 % (ref 4.6–6.5)

## 2023-08-26 LAB — VITAMIN B12: Vitamin B-12: 367 pg/mL (ref 211–911)

## 2023-08-26 MED ORDER — CYANOCOBALAMIN 500 MCG PO TABS: 500.0000 ug | ORAL_TABLET | Freq: Every day | ORAL | Status: AC

## 2023-08-26 MED ORDER — CYANOCOBALAMIN 1000 MCG/ML IJ SOLN
1000.0000 ug | Freq: Once | INTRAMUSCULAR | 4 refills | Status: DC
  Filled 2023-08-26: qty 3, 90d supply, fill #0

## 2023-08-26 MED ORDER — "SYRINGE/NEEDLE (DISP) 23G X 1-1/2"" 3 ML MISC"
1 refills | Status: AC
  Filled 2023-08-26: qty 6, 90d supply, fill #0
  Filled 2024-02-27: qty 6, 90d supply, fill #1

## 2023-08-26 NOTE — Telephone Encounter (Signed)
 See mychart.

## 2023-08-27 ENCOUNTER — Ambulatory Visit: Payer: Self-pay | Admitting: Family

## 2023-08-27 DIAGNOSIS — R42 Dizziness and giddiness: Secondary | ICD-10-CM

## 2023-08-28 ENCOUNTER — Ambulatory Visit (INDEPENDENT_AMBULATORY_CARE_PROVIDER_SITE_OTHER)

## 2023-08-28 ENCOUNTER — Other Ambulatory Visit (HOSPITAL_BASED_OUTPATIENT_CLINIC_OR_DEPARTMENT_OTHER): Payer: Self-pay

## 2023-08-28 DIAGNOSIS — E538 Deficiency of other specified B group vitamins: Secondary | ICD-10-CM

## 2023-08-28 MED ORDER — CYANOCOBALAMIN 1000 MCG/ML IJ SOLN
1000.0000 ug | Freq: Once | INTRAMUSCULAR | Status: AC
  Administered 2023-08-28: 1000 ug via INTRAMUSCULAR

## 2023-08-28 NOTE — Progress Notes (Signed)
 Teaching given on B-12 injections and the patient expresses understanding and acceptance of instructions.   Please continue the b12 injections once monthly. I sent the rx to your pharmacy for you to self administer.  You are welcome to schedule a nurse visit if you would like for administration teaching   Pt here for monthly B12 injection per original order dated:   B12 1000mcg given IM, and pt tolerated injection well. Pt gave injection to herself in Left Thigh.

## 2023-09-18 ENCOUNTER — Other Ambulatory Visit: Payer: Self-pay

## 2023-09-18 ENCOUNTER — Other Ambulatory Visit (HOSPITAL_COMMUNITY): Payer: Self-pay

## 2023-09-18 ENCOUNTER — Encounter (HOSPITAL_COMMUNITY): Payer: Self-pay

## 2023-09-18 MED ORDER — MECLIZINE HCL 25 MG PO TABS
25.0000 mg | ORAL_TABLET | Freq: Three times a day (TID) | ORAL | 0 refills | Status: AC | PRN
  Filled 2023-09-18: qty 30, 10d supply, fill #0

## 2023-09-18 NOTE — Addendum Note (Signed)
 Addended by: DARYL SETTER on: 09/18/2023 12:59 PM   Modules accepted: Orders

## 2023-09-22 ENCOUNTER — Encounter: Payer: Self-pay | Admitting: Family

## 2023-09-25 ENCOUNTER — Ambulatory Visit (INDEPENDENT_AMBULATORY_CARE_PROVIDER_SITE_OTHER)

## 2023-09-25 ENCOUNTER — Other Ambulatory Visit: Payer: Self-pay

## 2023-09-25 ENCOUNTER — Other Ambulatory Visit (HOSPITAL_BASED_OUTPATIENT_CLINIC_OR_DEPARTMENT_OTHER): Payer: Self-pay

## 2023-09-25 ENCOUNTER — Other Ambulatory Visit (HOSPITAL_COMMUNITY): Payer: Self-pay

## 2023-09-25 DIAGNOSIS — E538 Deficiency of other specified B group vitamins: Secondary | ICD-10-CM | POA: Diagnosis not present

## 2023-09-25 MED ORDER — BD ECLIPSE NEEDLE 25G X 1" MISC
1 refills | Status: AC
  Filled 2023-09-25 (×2): qty 3, 90d supply, fill #0
  Filled 2024-02-27 – 2024-02-29 (×2): qty 3, 90d supply, fill #1

## 2023-09-25 MED ORDER — CYANOCOBALAMIN 1000 MCG/ML IJ SOLN
1000.0000 ug | Freq: Once | INTRAMUSCULAR | Status: AC
  Administered 2023-09-25: 1000 ug via INTRAMUSCULAR

## 2023-09-25 NOTE — Progress Notes (Signed)
 Pt here for monthly B12 injection per original order dated: per Melissa O'Sullivan,NP  Last B12 injection:08/28/23  Last B12 level:    B12 1000mcg given IM, left deltoid and pt tolerated injection well.  She reports that the self administer did not work due to the wrong needle size, so she wanted to come in to have the office to due it for her. Needles for send in  to pharmacy for self administer of the B12 injection.

## 2023-10-03 ENCOUNTER — Other Ambulatory Visit: Payer: Self-pay | Admitting: Family

## 2023-10-03 DIAGNOSIS — F418 Other specified anxiety disorders: Secondary | ICD-10-CM

## 2023-10-05 ENCOUNTER — Other Ambulatory Visit (HOSPITAL_COMMUNITY): Payer: Self-pay

## 2023-10-05 ENCOUNTER — Other Ambulatory Visit (HOSPITAL_BASED_OUTPATIENT_CLINIC_OR_DEPARTMENT_OTHER): Payer: Self-pay

## 2023-10-05 MED ORDER — OMEPRAZOLE 40 MG PO CPDR
40.0000 mg | DELAYED_RELEASE_CAPSULE | Freq: Every day | ORAL | 1 refills | Status: AC
  Filled 2023-10-05 – 2023-11-17 (×2): qty 90, 90d supply, fill #0
  Filled 2024-02-27: qty 90, 90d supply, fill #1

## 2023-10-05 MED ORDER — ATORVASTATIN CALCIUM 40 MG PO TABS
40.0000 mg | ORAL_TABLET | Freq: Every day | ORAL | 0 refills | Status: AC
  Filled 2023-10-05 – 2024-02-27 (×2): qty 90, 90d supply, fill #0

## 2023-10-05 MED ORDER — BUPROPION HCL ER (XL) 150 MG PO TB24
150.0000 mg | ORAL_TABLET | Freq: Every day | ORAL | 1 refills | Status: DC
  Filled 2023-10-05 – 2023-10-07 (×2): qty 90, 90d supply, fill #0
  Filled 2024-01-02: qty 90, 90d supply, fill #1

## 2023-10-07 ENCOUNTER — Other Ambulatory Visit (HOSPITAL_COMMUNITY): Payer: Self-pay

## 2023-10-07 ENCOUNTER — Other Ambulatory Visit: Payer: Self-pay

## 2023-10-07 ENCOUNTER — Encounter: Payer: Self-pay | Admitting: Pharmacist

## 2023-10-09 ENCOUNTER — Ambulatory Visit (HOSPITAL_BASED_OUTPATIENT_CLINIC_OR_DEPARTMENT_OTHER): Admitting: Orthopaedic Surgery

## 2023-10-14 ENCOUNTER — Ambulatory Visit (HOSPITAL_BASED_OUTPATIENT_CLINIC_OR_DEPARTMENT_OTHER): Admitting: Nurse Practitioner

## 2023-10-16 ENCOUNTER — Encounter: Payer: Self-pay | Admitting: Family

## 2023-10-20 ENCOUNTER — Other Ambulatory Visit: Payer: Self-pay

## 2023-10-20 ENCOUNTER — Other Ambulatory Visit (HOSPITAL_COMMUNITY): Payer: Self-pay

## 2023-10-20 DIAGNOSIS — E538 Deficiency of other specified B group vitamins: Secondary | ICD-10-CM

## 2023-10-20 MED ORDER — CYANOCOBALAMIN 1000 MCG/ML IJ SOLN
1000.0000 ug | INTRAMUSCULAR | 4 refills | Status: AC
  Filled 2023-10-20 – 2023-10-26 (×5): qty 4, 120d supply, fill #0
  Filled 2023-10-28 – 2024-02-27 (×2): qty 4, 120d supply, fill #1

## 2023-10-22 ENCOUNTER — Other Ambulatory Visit (HOSPITAL_COMMUNITY): Payer: Self-pay

## 2023-10-22 ENCOUNTER — Ambulatory Visit: Admitting: Cardiology

## 2023-10-23 ENCOUNTER — Other Ambulatory Visit (HOSPITAL_COMMUNITY): Payer: Self-pay

## 2023-10-24 ENCOUNTER — Encounter (HOSPITAL_COMMUNITY): Payer: Self-pay

## 2023-10-26 ENCOUNTER — Other Ambulatory Visit (HOSPITAL_COMMUNITY): Payer: Self-pay

## 2023-10-27 ENCOUNTER — Encounter: Payer: Self-pay | Admitting: Family

## 2023-10-28 ENCOUNTER — Other Ambulatory Visit (HOSPITAL_COMMUNITY): Payer: Self-pay

## 2023-11-18 ENCOUNTER — Other Ambulatory Visit (HOSPITAL_COMMUNITY): Payer: Self-pay

## 2023-11-18 ENCOUNTER — Other Ambulatory Visit: Payer: Self-pay

## 2023-11-26 ENCOUNTER — Encounter: Payer: Self-pay | Admitting: Family

## 2023-12-03 IMAGING — CT CT HEAD W/O CM
3 series · 14 of 47 positions shown, 16 images · non-contrast
Comparison: No prior head CT or MRI.

CLINICAL DATA: Head injury 2 days ago with persistent head and neck
pain since then.



[Series 4: head 5.0 h30s · axial · 0.42mm/px · z∈[+1187,+1322]mm · 8 of 33 slices shown, 10 images]
[im 3/33  brain]
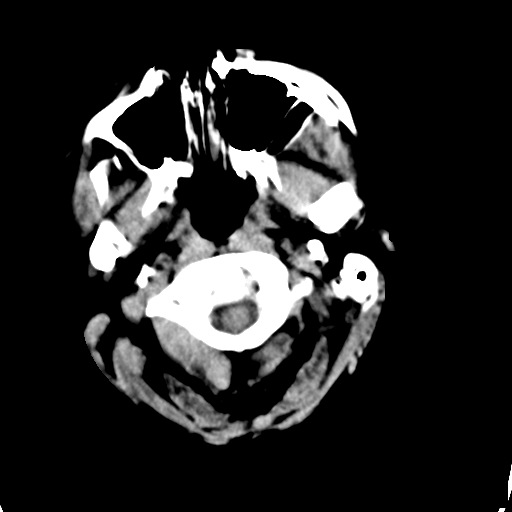
[im 3/33  bone]
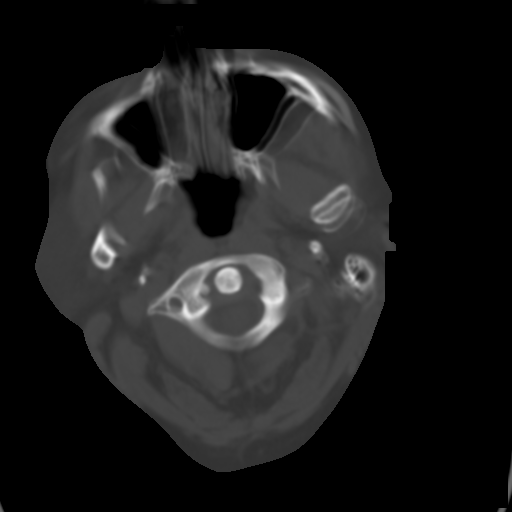
[im 7/33  brain]
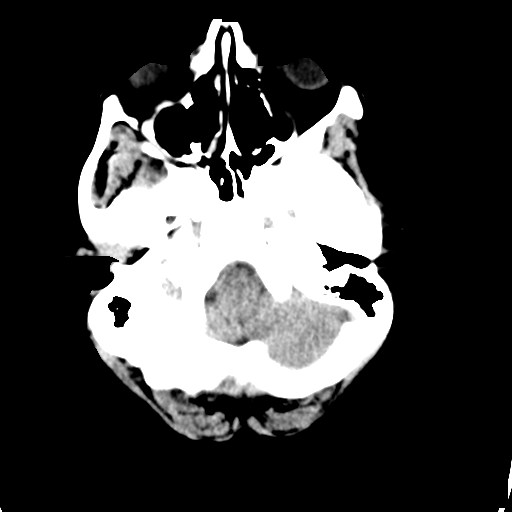
[im 10/33  brain]
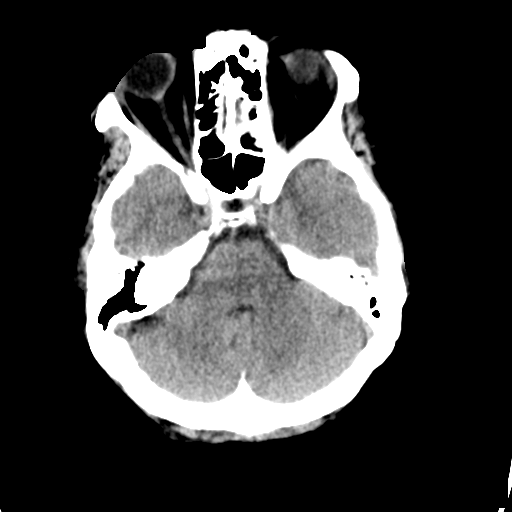
[im 15/33  brain]
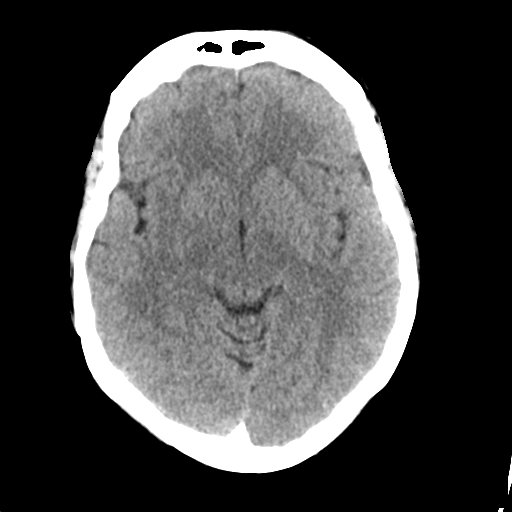
[im 18/33  brain]
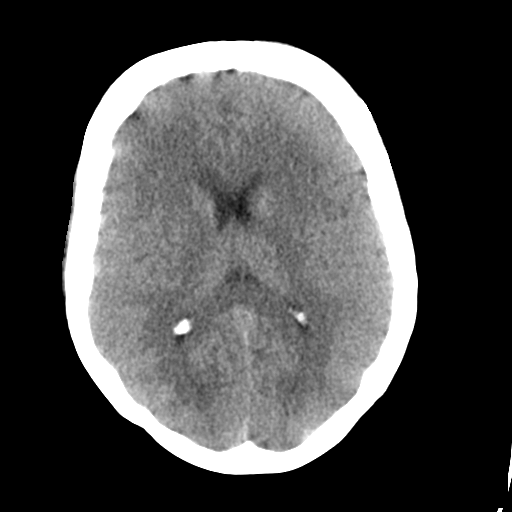
[im 18/33  bone]
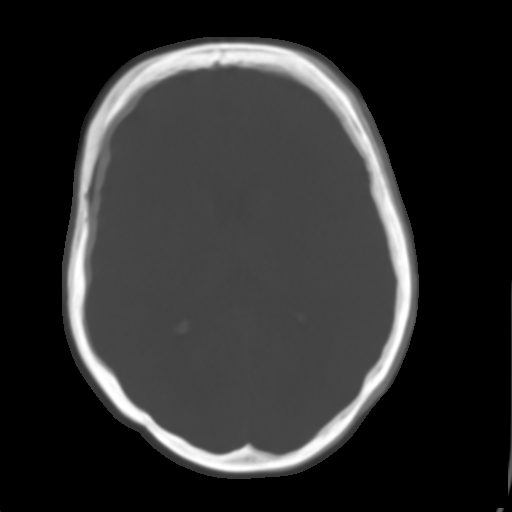
[im 23/33  brain]
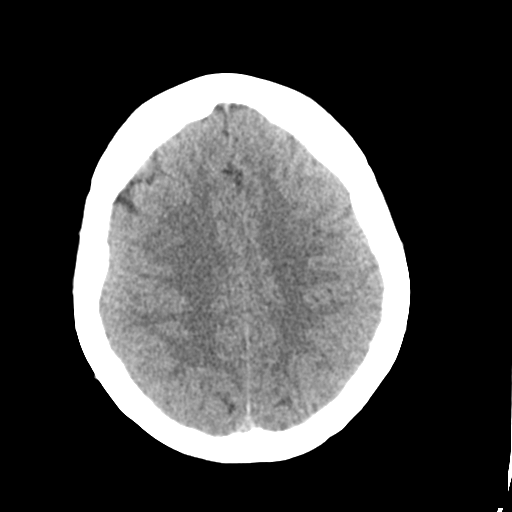
[im 26/33  brain]
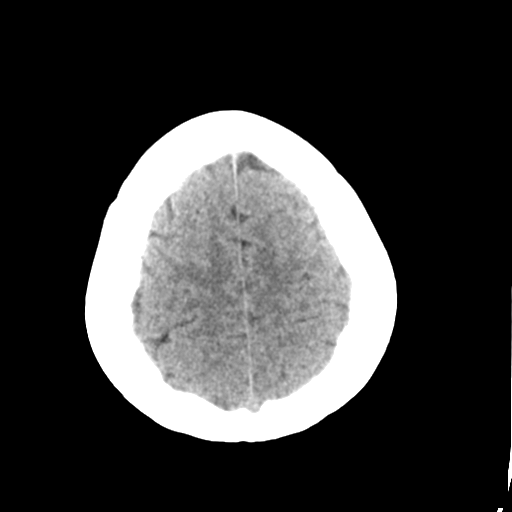
[im 30/33  brain]
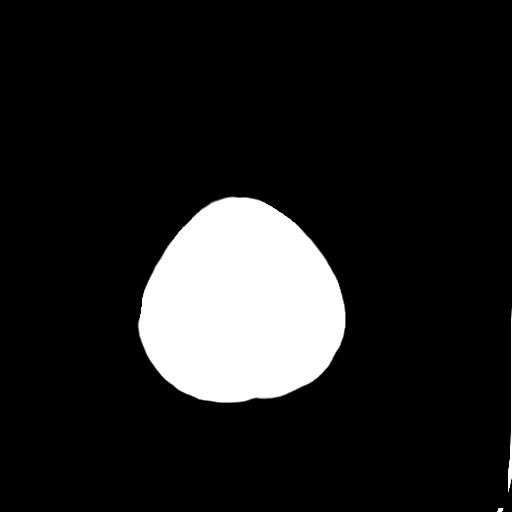

[Series 6: head 3.0 mpr cor · coronal · 0.32mm/px · 3 of 70 slices shown]
[im 24/70  brain]
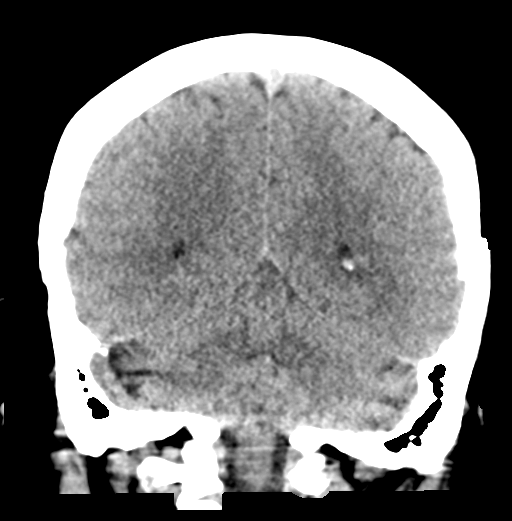
[im 31/70  brain]
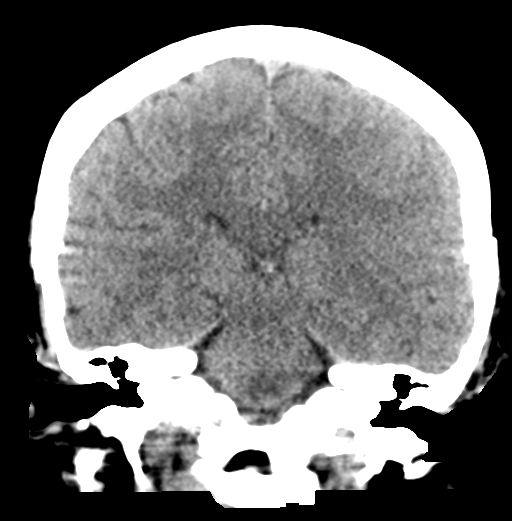
[im 39/70  brain]
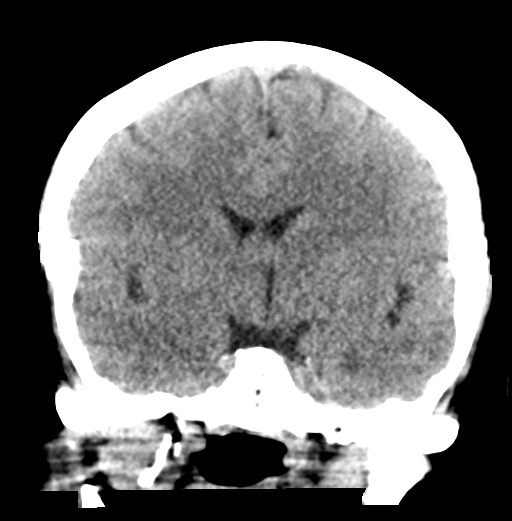

[Series 7: head 3.0 mpr sag · sagittal · 0.35mm/px · 3 of 56 slices shown]
[im 19/56  brain]
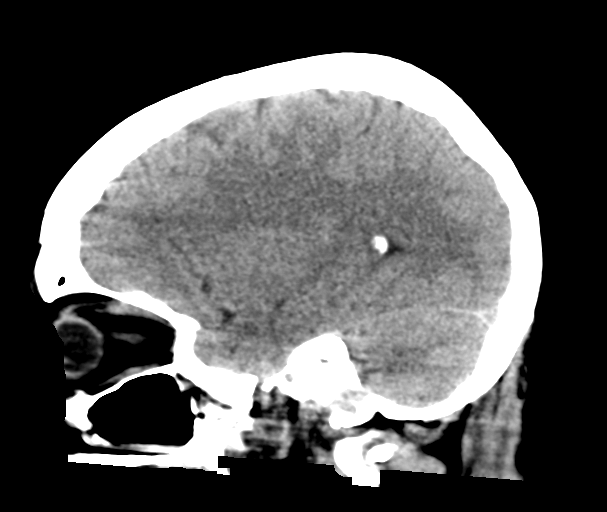
[im 28/56  brain]
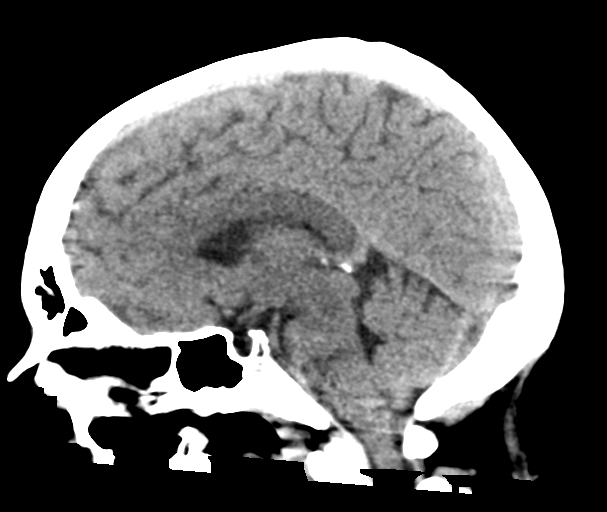
[im 37/56  brain]
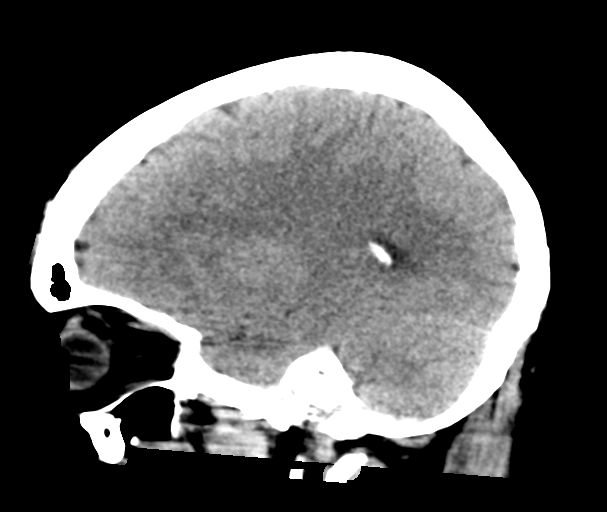

[14 of 47 positions shown; findings below may reference images not displayed]

Comparison is made with
preoperative MRI cervical spine 07/17/2020, postoperative cervical
spine plain films 09/17/2020
FINDINGS: CT HEAD FINDINGS

Brain: Motion artifact limits the study. There are no findings of
significant atrophy or visible small-vessel disease. No obvious
acute infarct, hemorrhage or mass are seen through the motion
artifact. The ventricles are normal in size and position.

Vascular: No hyperdense vessel or unexpected calcification.

Skull: No depressed fracture or skull lesion is seen. There is no
visible scalp hematoma but there is mild swelling in the left
frontal scalp which could be due to scalp contusion.

Sinuses/Orbits: There is scattered membrane thickening in the
ethmoids. Other visible sinuses, bilateral mastoid air cells are
clear accounting for motion.

Other: None.

CT CERVICAL SPINE FINDINGS

Alignment: Straightened but otherwise normal allowing for motion
artifact.

Skull base and vertebrae: Motion artifact limits the exam. No
compression fracture or displaced fracture is seen but a subtle
fracture could be missed. There is normal bone mineralization with
no primary bone lesions noted.

Again noted is anterior plate fusion hardware with interbody
metallic disc space apparatus from C4-7. Disc spaces are still
visualized peripheral to the interbody metal hardware.

Soft tissues and spinal canal: No prevertebral fluid or swelling. No
visible canal hematoma. There are small calcifications in both
carotid bifurcations.

Disc levels: There is normal disc height at C2-3, mild partial disc
space loss C3-4 and C7-T1. Remaining cervical discs with fusion
hardware.

Posterior osteophytes at the level of the disc fusions at C5-6 and
C6-7 encroach on the ventral thecal sac and ventral cord surface
without obvious cord compression.

Primarily due to uncinate joint spurring, foraminal stenosis is
noted which is bilaterally mild-to-moderate at C3-4, moderate on the
left at C4-5, bilaterally moderate to severe C5-6 and moderate on
the left and moderate to severe on the right at C6-7.

Other levels do not show significant soft tissue or bony
encroachment on the foramina or the thecal sac.

Upper chest: Negative.

Other: None.
IMPRESSION: 1. Motion limited exam grossly negative for acute intracranial
process. Follow-up with MRI when clinically feasible if there is
continued concern.
2. Left frontal scalp contusion without evidence of depressed skull
fractures.
3. Degenerative and postsurgical changes of the cervical spine
without obvious fracture or malalignment on motion limited cervical
spine CT.

## 2023-12-03 IMAGING — CT CT ABD-PELV W/ CM
2 of 5 series · 16 of 46 positions shown, 18 images · IV contrast (agent unspecified)
Comparison: CT abdomen and pelvis 07/22/2018

CLINICAL DATA: Left lower quadrant pain.

EXAM:
CT ABDOMEN AND PELVIS WITH CONTRAST
TECHNIQUE: Multidetector CT imaging of the abdomen and pelvis was performed
using the standard protocol following bolus administration of
intravenous contrast.

[Series 4: abd/ pelvis 5.0 i30f 2 · axial · 0.91mm/px · z∈[+458,+933]mm · 13 of 107 slices shown, 15 images]
[im 6/107  soft-tissue]
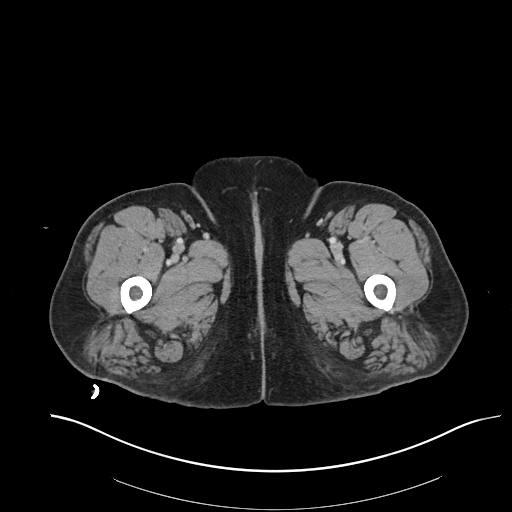
[im 6/107  bone]
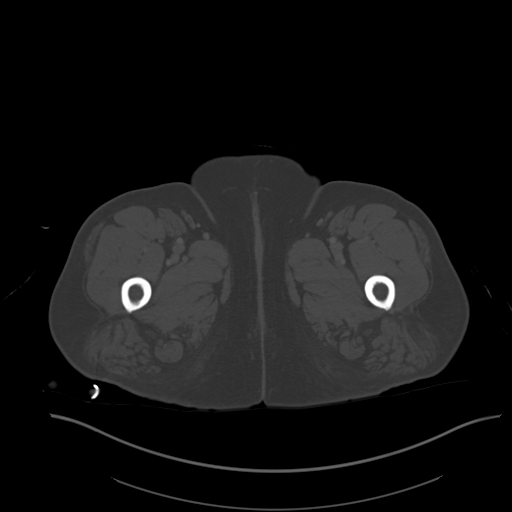
[im 17/107  soft-tissue]
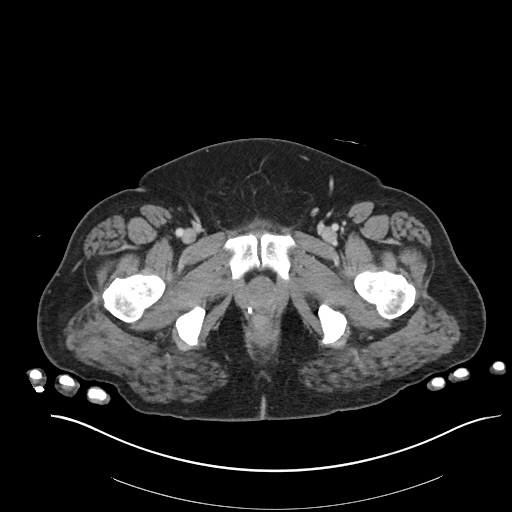
[im 23/107  soft-tissue]
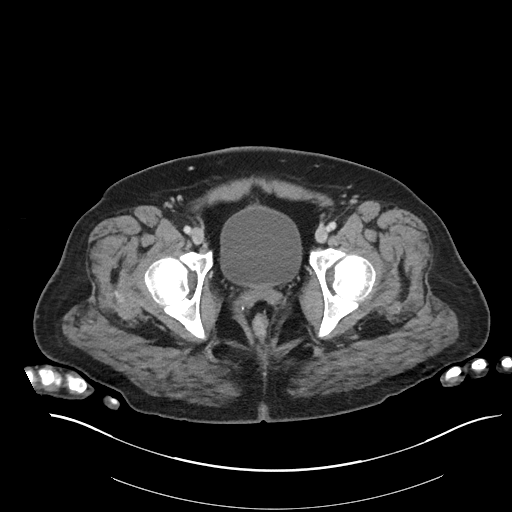
[im 28/107  soft-tissue]
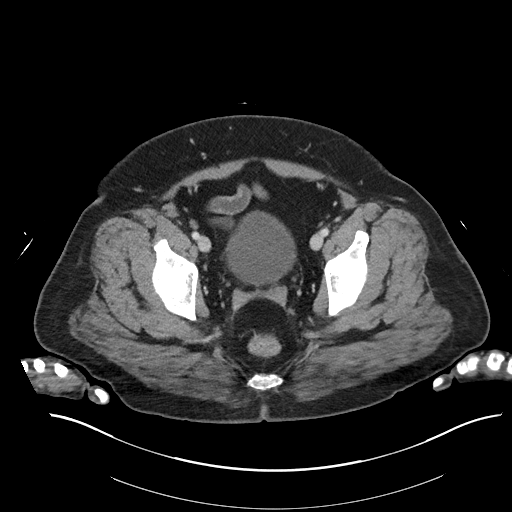
[im 40/107  soft-tissue]
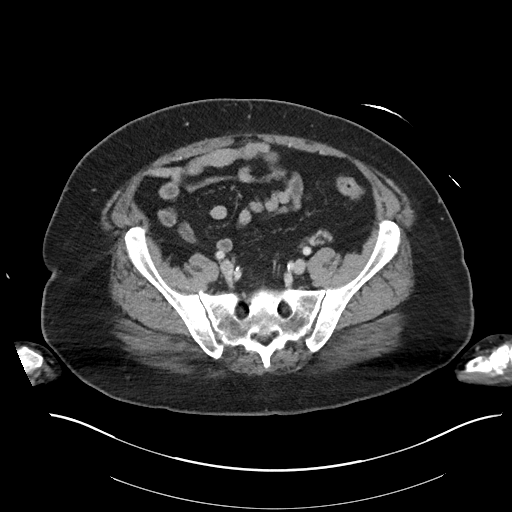
[im 45/107  soft-tissue]
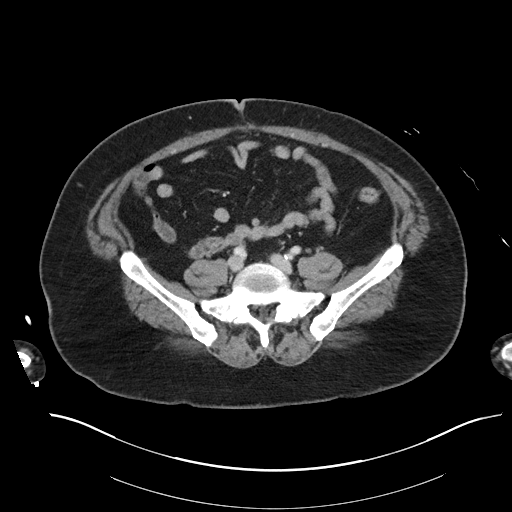
[im 56/107  soft-tissue]
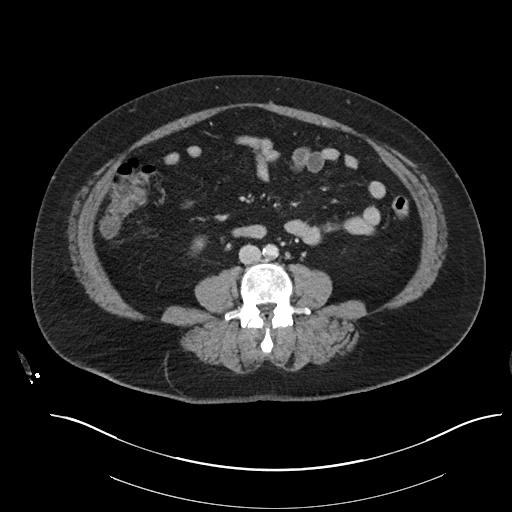
[im 62/107  soft-tissue]
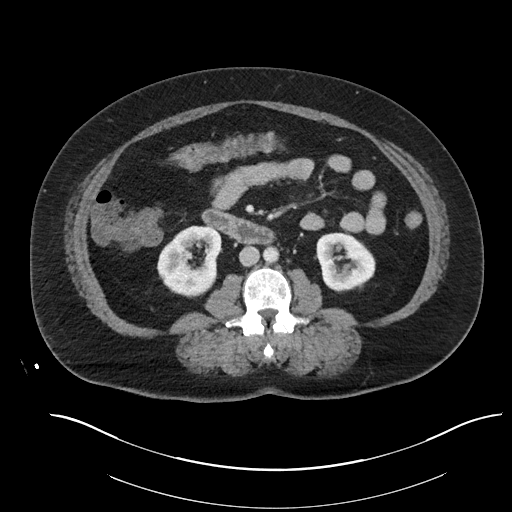
[im 67/107  soft-tissue]
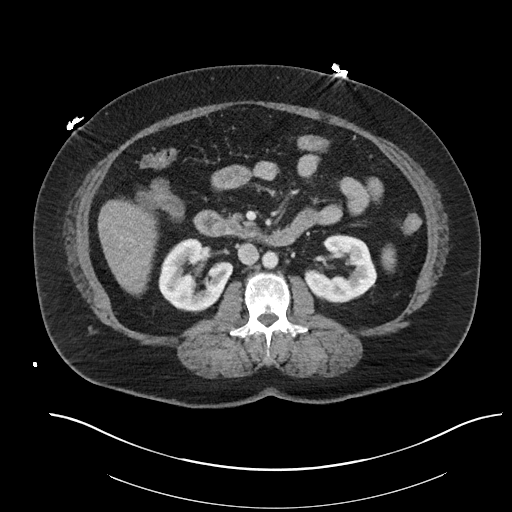
[im 67/107  bone]
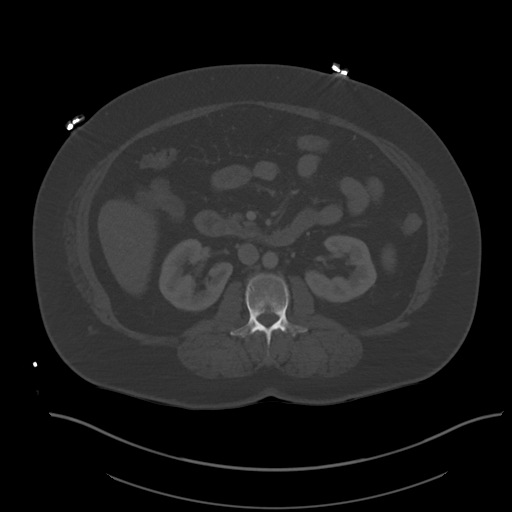
[im 79/107  soft-tissue]
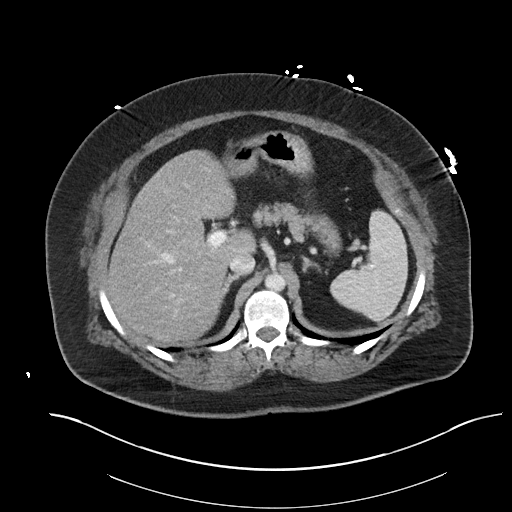
[im 84/107  soft-tissue]
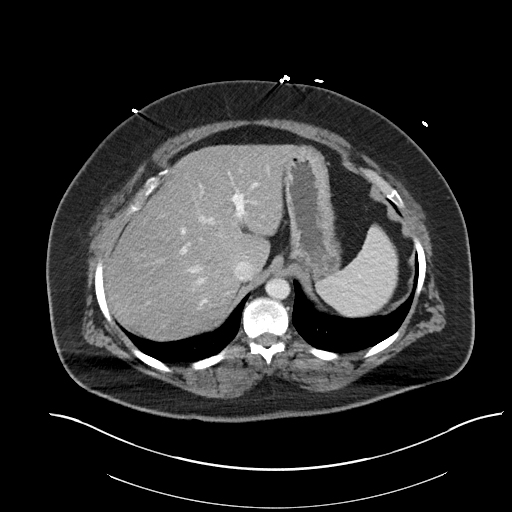
[im 90/107  soft-tissue]
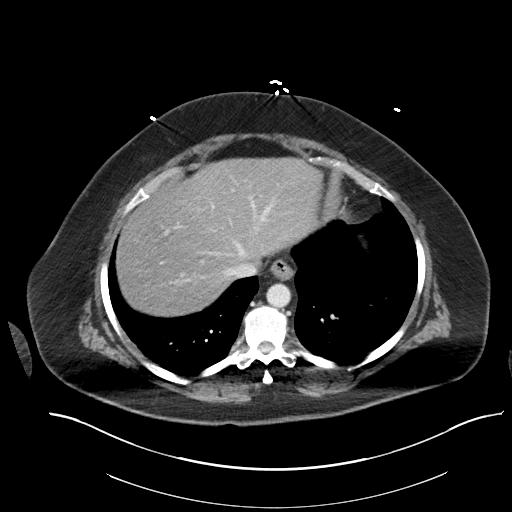
[im 101/107  soft-tissue]
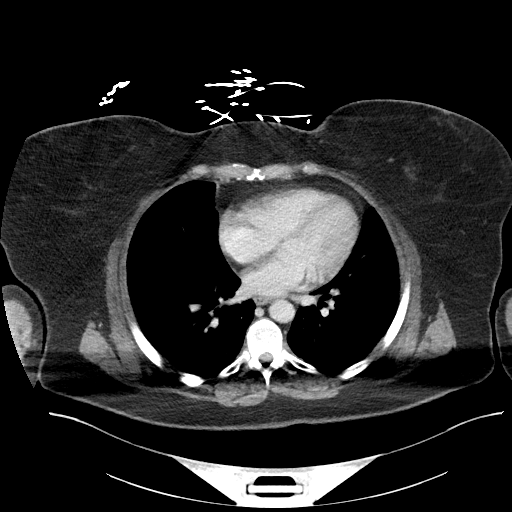

[Series 7: coronal soft tissue · coronal · 0.86mm/px · 3 of 109 slices shown]
[im 37/109  soft-tissue]
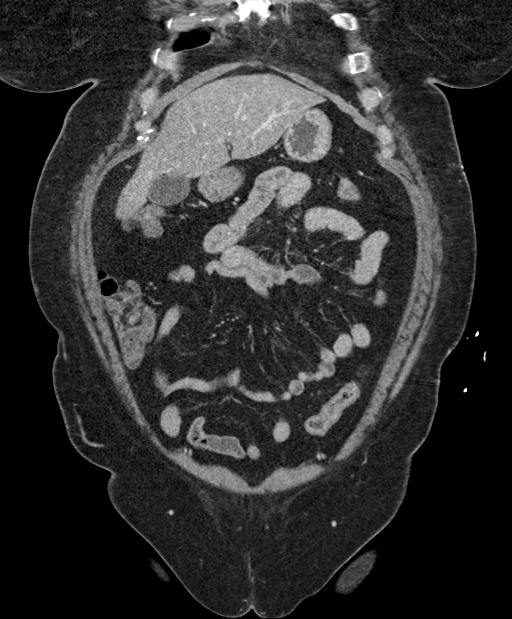
[im 49/109  soft-tissue]
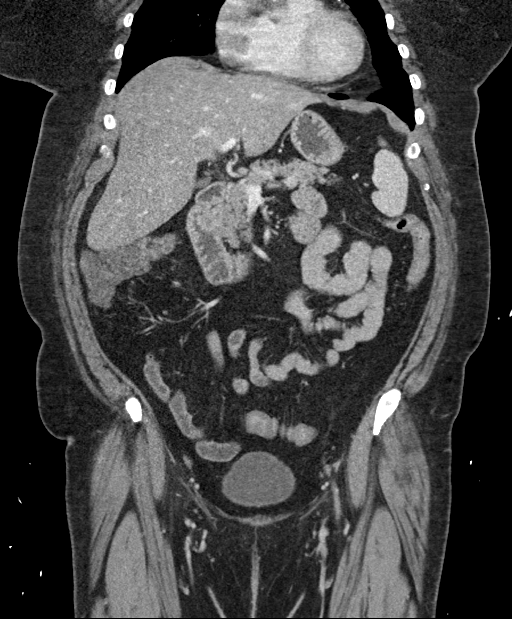
[im 61/109  soft-tissue]
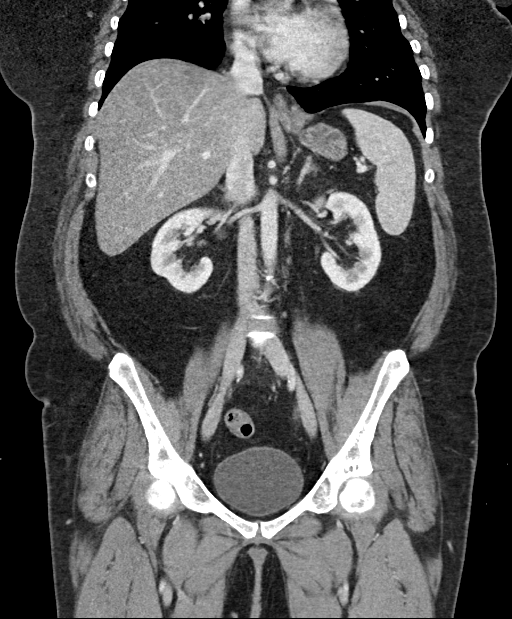

[16 of 46 positions shown; findings below may reference images not displayed]

RADIATION DOSE REDUCTION: This exam was performed according to the
departmental dose-optimization program which includes automated
exposure control, adjustment of the mA and/or kV according to
patient size and/or use of iterative reconstruction technique.

CONTRAST:  100mL OMNIPAQUE IOHEXOL 300 MG/ML  SOLN
FINDINGS: Lower chest: No acute abnormality.

Hepatobiliary: There is diffuse fatty infiltration of the liver.
Gallbladder and bile ducts are within normal limits.

Pancreas: Unremarkable. No pancreatic ductal dilatation or
surrounding inflammatory changes.

Spleen: Normal in size without focal abnormality.

Adrenals/Urinary Tract: Adrenal glands are unremarkable. Kidneys are
normal, without renal calculi, focal lesion, or hydronephrosis.
Bladder is unremarkable.

Stomach/Bowel: Stomach is within normal limits. Appendix appears
normal. No evidence of bowel wall thickening, distention, or
inflammatory changes.

Vascular/Lymphatic: Aortic atherosclerosis. No enlarged abdominal or
pelvic lymph nodes.

Reproductive: Status post hysterectomy. No adnexal masses.

Other: No abdominal wall hernia or abnormality. No abdominopelvic
ascites.

Musculoskeletal: No acute or significant osseous findings.
IMPRESSION: 1. No acute process in the abdomen or pelvis.
2. Hepatic steatosis.
3.  Aortic Atherosclerosis ([id number]).

## 2023-12-16 ENCOUNTER — Ambulatory Visit: Admitting: Orthopaedic Surgery

## 2024-01-04 ENCOUNTER — Encounter: Payer: Self-pay | Admitting: Radiology

## 2024-01-04 ENCOUNTER — Other Ambulatory Visit (HOSPITAL_COMMUNITY): Payer: Self-pay

## 2024-01-04 ENCOUNTER — Encounter (HOSPITAL_COMMUNITY): Payer: Self-pay

## 2024-02-27 ENCOUNTER — Other Ambulatory Visit (HOSPITAL_COMMUNITY): Payer: Self-pay

## 2024-02-27 ENCOUNTER — Other Ambulatory Visit: Payer: Self-pay

## 2024-02-28 ENCOUNTER — Other Ambulatory Visit (HOSPITAL_COMMUNITY): Payer: Self-pay

## 2024-02-29 ENCOUNTER — Other Ambulatory Visit: Payer: Self-pay

## 2024-03-02 ENCOUNTER — Encounter: Payer: Self-pay | Admitting: Pharmacist

## 2024-03-02 ENCOUNTER — Other Ambulatory Visit: Payer: Self-pay

## 2024-03-02 ENCOUNTER — Other Ambulatory Visit (HOSPITAL_COMMUNITY): Payer: Self-pay

## 2024-03-21 ENCOUNTER — Other Ambulatory Visit: Payer: Self-pay | Admitting: Family

## 2024-03-21 ENCOUNTER — Other Ambulatory Visit: Payer: Self-pay

## 2024-03-21 ENCOUNTER — Other Ambulatory Visit (HOSPITAL_COMMUNITY): Payer: Self-pay

## 2024-03-21 DIAGNOSIS — F418 Other specified anxiety disorders: Secondary | ICD-10-CM

## 2024-03-21 MED ORDER — BUPROPION HCL ER (XL) 150 MG PO TB24
150.0000 mg | ORAL_TABLET | Freq: Every day | ORAL | 0 refills | Status: AC
  Filled 2024-03-21: qty 90, 90d supply, fill #0
